# Patient Record
Sex: Male | Born: 1950 | Race: White | Hispanic: No | Marital: Single | Smoking: Former smoker
Health system: Southern US, Community
[De-identification: ages and names within clinical notes are randomized; demographics above are authoritative.]

## PROBLEM LIST (undated history)

## (undated) DIAGNOSIS — Z9889 Other specified postprocedural states: Secondary | ICD-10-CM

## (undated) DIAGNOSIS — I10 Essential (primary) hypertension: Secondary | ICD-10-CM

## (undated) DIAGNOSIS — R002 Palpitations: Secondary | ICD-10-CM

## (undated) DIAGNOSIS — I6529 Occlusion and stenosis of unspecified carotid artery: Secondary | ICD-10-CM

## (undated) DIAGNOSIS — I499 Cardiac arrhythmia, unspecified: Secondary | ICD-10-CM

## (undated) DIAGNOSIS — E785 Hyperlipidemia, unspecified: Secondary | ICD-10-CM

## (undated) HISTORY — DX: Cardiac arrhythmia, unspecified: I49.9

## (undated) HISTORY — DX: Occlusion and stenosis of unspecified carotid artery: I65.29

## (undated) HISTORY — DX: Hyperlipidemia, unspecified: E78.5

## (undated) HISTORY — DX: Essential (primary) hypertension: I10

## (undated) HISTORY — DX: Palpitations: R00.2

## (undated) HISTORY — DX: Other specified postprocedural states: Z98.890

---

## 1970-08-11 HISTORY — PX: PILONIDAL CYST EXCISION: SHX744

## 2016-12-23 DIAGNOSIS — E785 Hyperlipidemia, unspecified: Secondary | ICD-10-CM | POA: Diagnosis not present

## 2016-12-23 DIAGNOSIS — I10 Essential (primary) hypertension: Secondary | ICD-10-CM | POA: Diagnosis not present

## 2016-12-23 DIAGNOSIS — Z720 Tobacco use: Secondary | ICD-10-CM | POA: Diagnosis not present

## 2017-02-10 DIAGNOSIS — M79671 Pain in right foot: Secondary | ICD-10-CM | POA: Diagnosis not present

## 2017-02-10 DIAGNOSIS — L0889 Other specified local infections of the skin and subcutaneous tissue: Secondary | ICD-10-CM | POA: Diagnosis not present

## 2017-03-10 DIAGNOSIS — J329 Chronic sinusitis, unspecified: Secondary | ICD-10-CM | POA: Diagnosis not present

## 2017-03-10 DIAGNOSIS — H6690 Otitis media, unspecified, unspecified ear: Secondary | ICD-10-CM | POA: Diagnosis not present

## 2018-01-05 DIAGNOSIS — I10 Essential (primary) hypertension: Secondary | ICD-10-CM | POA: Diagnosis not present

## 2018-01-05 DIAGNOSIS — E785 Hyperlipidemia, unspecified: Secondary | ICD-10-CM | POA: Diagnosis not present

## 2018-06-08 DIAGNOSIS — D485 Neoplasm of uncertain behavior of skin: Secondary | ICD-10-CM | POA: Diagnosis not present

## 2018-06-08 DIAGNOSIS — Z85828 Personal history of other malignant neoplasm of skin: Secondary | ICD-10-CM | POA: Diagnosis not present

## 2018-06-08 DIAGNOSIS — L570 Actinic keratosis: Secondary | ICD-10-CM | POA: Diagnosis not present

## 2018-09-07 DIAGNOSIS — C44612 Basal cell carcinoma of skin of right upper limb, including shoulder: Secondary | ICD-10-CM | POA: Diagnosis not present

## 2018-09-07 DIAGNOSIS — D225 Melanocytic nevi of trunk: Secondary | ICD-10-CM | POA: Diagnosis not present

## 2018-09-07 DIAGNOSIS — Z85828 Personal history of other malignant neoplasm of skin: Secondary | ICD-10-CM | POA: Diagnosis not present

## 2018-09-07 DIAGNOSIS — C44229 Squamous cell carcinoma of skin of left ear and external auricular canal: Secondary | ICD-10-CM | POA: Diagnosis not present

## 2018-09-07 DIAGNOSIS — L821 Other seborrheic keratosis: Secondary | ICD-10-CM | POA: Diagnosis not present

## 2018-09-07 DIAGNOSIS — L814 Other melanin hyperpigmentation: Secondary | ICD-10-CM | POA: Diagnosis not present

## 2018-12-17 ENCOUNTER — Other Ambulatory Visit: Payer: Self-pay | Admitting: Family Medicine

## 2018-12-17 DIAGNOSIS — G453 Amaurosis fugax: Secondary | ICD-10-CM | POA: Diagnosis not present

## 2018-12-17 DIAGNOSIS — I10 Essential (primary) hypertension: Secondary | ICD-10-CM | POA: Diagnosis not present

## 2018-12-17 DIAGNOSIS — R42 Dizziness and giddiness: Secondary | ICD-10-CM | POA: Diagnosis not present

## 2018-12-17 DIAGNOSIS — E785 Hyperlipidemia, unspecified: Secondary | ICD-10-CM | POA: Diagnosis not present

## 2018-12-17 DIAGNOSIS — H53121 Transient visual loss, right eye: Secondary | ICD-10-CM

## 2018-12-17 DIAGNOSIS — R0989 Other specified symptoms and signs involving the circulatory and respiratory systems: Secondary | ICD-10-CM | POA: Diagnosis not present

## 2018-12-21 ENCOUNTER — Other Ambulatory Visit: Payer: Self-pay | Admitting: Family Medicine

## 2018-12-21 ENCOUNTER — Other Ambulatory Visit (HOSPITAL_COMMUNITY): Payer: Self-pay | Admitting: Family Medicine

## 2018-12-21 ENCOUNTER — Ambulatory Visit
Admission: RE | Admit: 2018-12-21 | Discharge: 2018-12-21 | Disposition: A | Discharge status: Home / Self Care | Payer: PPO | Source: Ambulatory Visit | Attending: Family Medicine | Admitting: Family Medicine

## 2018-12-21 DIAGNOSIS — H53121 Transient visual loss, right eye: Secondary | ICD-10-CM

## 2018-12-22 ENCOUNTER — Other Ambulatory Visit: Payer: Self-pay | Admitting: Family Medicine

## 2018-12-22 DIAGNOSIS — R42 Dizziness and giddiness: Secondary | ICD-10-CM

## 2018-12-22 DIAGNOSIS — H5360 Unspecified night blindness: Secondary | ICD-10-CM

## 2018-12-23 ENCOUNTER — Other Ambulatory Visit: Payer: Self-pay

## 2018-12-23 ENCOUNTER — Ambulatory Visit
Admission: RE | Admit: 2018-12-23 | Discharge: 2018-12-23 | Disposition: A | Discharge status: Home / Self Care | Payer: PPO | Source: Ambulatory Visit | Attending: Family Medicine | Admitting: Family Medicine

## 2018-12-23 DIAGNOSIS — R42 Dizziness and giddiness: Secondary | ICD-10-CM

## 2018-12-23 DIAGNOSIS — H5360 Unspecified night blindness: Secondary | ICD-10-CM

## 2018-12-23 MED ORDER — GADOBENATE DIMEGLUMINE 529 MG/ML IV SOLN
20.0000 mL | Freq: Once | INTRAVENOUS | Status: AC | PRN
  Administered 2018-12-23: 20 mL via INTRAVENOUS

## 2018-12-28 ENCOUNTER — Telehealth (HOSPITAL_COMMUNITY): Payer: Self-pay | Admitting: *Deleted

## 2018-12-28 NOTE — Telephone Encounter (Signed)

## 2018-12-30 ENCOUNTER — Other Ambulatory Visit: Payer: Self-pay

## 2018-12-30 ENCOUNTER — Ambulatory Visit (HOSPITAL_COMMUNITY): Payer: PPO | Attending: Internal Medicine

## 2018-12-30 DIAGNOSIS — H53121 Transient visual loss, right eye: Secondary | ICD-10-CM | POA: Diagnosis not present

## 2018-12-30 DIAGNOSIS — E785 Hyperlipidemia, unspecified: Secondary | ICD-10-CM | POA: Insufficient documentation

## 2018-12-30 DIAGNOSIS — I77810 Thoracic aortic ectasia: Secondary | ICD-10-CM | POA: Diagnosis not present

## 2018-12-30 DIAGNOSIS — I351 Nonrheumatic aortic (valve) insufficiency: Secondary | ICD-10-CM | POA: Diagnosis not present

## 2018-12-30 DIAGNOSIS — I119 Hypertensive heart disease without heart failure: Secondary | ICD-10-CM | POA: Insufficient documentation

## 2019-01-11 ENCOUNTER — Other Ambulatory Visit: Payer: Self-pay

## 2019-01-11 DIAGNOSIS — I6521 Occlusion and stenosis of right carotid artery: Secondary | ICD-10-CM

## 2019-01-17 ENCOUNTER — Telehealth (HOSPITAL_COMMUNITY): Payer: Self-pay | Admitting: Rehabilitation

## 2019-01-17 NOTE — Telephone Encounter (Signed)

## 2019-01-18 ENCOUNTER — Other Ambulatory Visit: Payer: Self-pay

## 2019-01-18 ENCOUNTER — Ambulatory Visit (INDEPENDENT_AMBULATORY_CARE_PROVIDER_SITE_OTHER): Payer: PPO | Admitting: Vascular Surgery

## 2019-01-18 ENCOUNTER — Encounter: Payer: Self-pay | Admitting: Vascular Surgery

## 2019-01-18 ENCOUNTER — Ambulatory Visit (HOSPITAL_COMMUNITY)
Admission: RE | Admit: 2019-01-18 | Discharge: 2019-01-18 | Disposition: A | Discharge status: Home / Self Care | Payer: PPO | Source: Ambulatory Visit | Attending: Family | Admitting: Family

## 2019-01-18 ENCOUNTER — Other Ambulatory Visit: Payer: Self-pay | Admitting: *Deleted

## 2019-01-18 ENCOUNTER — Encounter: Payer: Self-pay | Admitting: *Deleted

## 2019-01-18 DIAGNOSIS — I6521 Occlusion and stenosis of right carotid artery: Secondary | ICD-10-CM

## 2019-01-18 DIAGNOSIS — I6529 Occlusion and stenosis of unspecified carotid artery: Secondary | ICD-10-CM | POA: Insufficient documentation

## 2019-01-18 MED ORDER — CLOPIDOGREL BISULFATE 75 MG PO TABS: 75.0000 mg | ORAL_TABLET | Freq: Every day | ORAL | 6 refills | Status: DC

## 2019-01-18 NOTE — Progress Notes (Signed)
Patient name: Eric Newman MRN: [id number] DOB: Sep 24, 1950 Sex: male  REASON FOR CONSULT: Concern for symptomatic right carotid stenosis  HPI: Eric Newman is a 68 y.o. male, with history of hypertension that presents for evaluation of right carotid stenosis.  Patient states he had an event in early May when he lost vision suddenly in his right eye for approximately 3 minutes.  States it was very similar to a shade coming down over his visual field.  Ultimately went and saw his primary care doctor who sent him to an ophthalmologist and he states he had a good checkup.  An MRI of his brain was done that showed no acute intracranial abnormality.  He did have a carotid duplex that showed greater than 70% right carotid stenosis.  Patient states he has been on Plavix per his PCP.  He has had no recurrent events.  When he had his vision loss he had no unilateral arm or leg symptoms otherwise.  He denies any previous head or neck surgery.  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  Past Medical History:  Diagnosis Date  . Hypertension    PSH: no head or neck surgery  No family history on file.  SOCIAL HISTORY: Social History   Socioeconomic History  . Marital status: Married    Spouse name: Not on file  . Number of children: Not on file  . Years of education: Not on file  . Highest education level: Not on file  Occupational History  . Not on file  Social Needs  . Financial resource strain: Not on file  . Food insecurity:    Worry: Not on file    Inability: Not on file  . Transportation needs:    Medical: Not on file    Non-medical: Not on file  Tobacco Use  . Smoking status: Former Research scientist (life sciences)  . Smokeless tobacco: Never Used  Substance and Sexual Activity  . Alcohol use: Not on file  . Drug use: Not on file  . Sexual activity: Not on file  Lifestyle  . Physical activity:    Days per week: Not on file    Minutes per session: Not on file  . Stress: Not on file   Relationships  . Social connections:    Talks on phone: Not on file    Gets together: Not on file    Attends religious service: Not on file    Active member of club or organization: Not on file    Attends meetings of clubs or organizations: Not on file    Relationship status: Not on file  . Intimate partner violence:    Fear of current or ex partner: Not on file    Emotionally abused: Not on file    Physically abused: Not on file    Forced sexual activity: Not on file  Other Topics Concern  . Not on file  Social History Narrative  . Not on file    Allergies no known allergies  Current Outpatient Medications  Medication Sig Dispense Refill  . aspirin EC 81 MG tablet Take 81 mg by mouth daily.    . famotidine (PEPCID) 10 MG tablet Take 10 mg by mouth 2 (two) times daily.    Marland Kitchen lisinopril-hydrochlorothiazide (ZESTORETIC) 10-12.5 MG tablet TAKE 1 TABLET BY MOUTH ONCE DAILY FOR 90 DAYS    . rosuvastatin (CRESTOR) 20 MG tablet TAKE 1 TABLET BY MOUTH ONCE DAILY FOR 90 DAYS    . clopidogrel (  PLAVIX) 75 MG tablet Take 1 tablet (75 mg total) by mouth daily. 30 tablet 6   No current facility-administered medications for this visit.     REVIEW OF SYSTEMS:  [X]  denotes positive finding, [ ]  denotes negative finding Cardiac  Comments:  Chest pain or chest pressure:    Shortness of breath upon exertion:    Short of breath when lying flat:    Irregular heart rhythm:        Vascular    Pain in calf, thigh, or hip brought on by ambulation:    Pain in feet at night that wakes you up from your sleep:     Blood clot in your veins:    Leg swelling:         Pulmonary    Oxygen at home:    Productive cough:     Wheezing:         Neurologic    Sudden weakness in arms or legs:     Sudden numbness in arms or legs:     Sudden onset of difficulty speaking or slurred speech:    Temporary loss of vision in one eye:     Problems with dizziness:         Gastrointestinal    Blood in stool:      Vomited blood:         Genitourinary    Burning when urinating:     Blood in urine:        Psychiatric    Major depression:         Hematologic    Bleeding problems:    Problems with blood clotting too easily:        Skin    Rashes or ulcers:        Constitutional    Fever or chills:      PHYSICAL EXAM: Vitals:   01/18/19 1210  BP: 132/74  Pulse: 63  Temp: 99.1 F (37.3 C)  TempSrc: Temporal  SpO2: 95%  Weight: 208 lb 6.4 oz (94.5 kg)  Height: 5\' 4"  (1.626 m)    GENERAL: The patient is a well-nourished male, in no acute distress. The vital signs are documented above. CARDIAC: There is a regular rate and rhythm.  VASCULAR:  Right carotid bruit Radial pulse 2+ palpable bilateral upper extremities PULMONARY: There is good air exchange bilaterally without wheezing or rales. ABDOMEN: Soft and non-tender with normal pitched bowel sounds.  MUSCULOSKELETAL: There are no major deformities or cyanosis. NEUROLOGIC: No focal weakness or paresthesias are detected.  Cn II-XII grossly intact.   DATA:   MRI brain 12/23/2018: No acute intracranial abnormality.  Carotid duplex here in clinic shows a greater than 80% right ICA stenosis with velocity of 705/283. Duplex prior to clinic showed <50% L ICA stenosis.  Assessment/Plan:  68 year old male that presents with symptomatic high-grade right carotid stenosis.  I discussed in detail that I think his right eye vision loss in May was amaurosis fugax with atheroembolic event from his carotid lesion.  I recommended right carotid endarterectomy for stroke risk reduction.  Surgery was discussed in detail as well as risks including bleeding, cranial nerve injury, and risk of intraoperative stroke. etc.  Patient seems somewhat surprised with my recommendation and was not expecting to need surgery today.  He is amendable to proceed and we will schedule him for next available date.  I did offer him this Thursday but he wants to do it next  week to give him time  to get his affairs in order.  I did refill his Plavix.  I instructed him to remain on dual antiplatelet therapy with aspirin Plavix.   Marty Heck, MD [facility] Office: [phone] Pager: [place]

## 2019-01-21 NOTE — Pre-Procedure Instructions (Signed)
[facility] [address] [place] [address] Phone: [phone] Fax: [phone]      Your procedure is scheduled on Thursday, June 18th.  Report to [facility] Main Entrance "A" at 10:00 A.M., and check in at the Admitting office.  Call this number if you have problems the morning of surgery:  [phone]  Call [phone] if you have any questions prior to your surgery date Monday-Friday 8am-4pm    Remember:  Do not eat or drink after midnight.   Take these medicines the morning of surgery with A SIP OF WATER   famotidine (PEPCID)   Follow your surgeon's instructions on when to stop/resume Plavix.  If no instructions were given by your surgeon then you will need to call the office to get those instructions.    As of today, STOP taking any, Aleve, Naproxen, Ibuprofen, Motrin, Advil, Goody's, BC's, all herbal medications, fish oil, and all vitamins.    The Morning of Surgery  Do not wear jewelry.  Do not wear lotions, powders, or colognes, or deodorant  Do not shave 48 hours prior to surgery.  Men may shave face and neck.  Do not bring valuables to the hospital.  [facility] is not responsible for any belongings or valuables.  If you are a smoker, DO NOT Smoke 24 hours prior to surgery IF you wear a CPAP at night please bring your mask, tubing, and machine the morning of surgery   Remember that you must have someone to transport you home after your surgery, and remain with you for 24 hours if you are discharged the same day.   Contacts, glasses, hearing aids, dentures or bridgework may not be worn into surgery.    Leave your suitcase in the car.  After surgery it may be brought to your room.  For patients admitted to the hospital, discharge time will be determined by your treatment team.  Patients discharged the day of surgery will not be allowed to drive home.    Special instructions:   [facility]-  Preparing For Surgery  Before surgery, you can play an important role. Because skin is not sterile, your skin needs to be as free of germs as possible. You can reduce the number of germs on your skin by washing with CHG (chlorahexidine gluconate) Soap before surgery.  CHG is an antiseptic cleaner which kills germs and bonds with the skin to continue killing germs even after washing.    Oral Hygiene is also important to reduce your risk of infection.  Remember - BRUSH YOUR TEETH THE MORNING OF SURGERY WITH YOUR REGULAR TOOTHPASTE  Please do not use if you have an allergy to CHG or antibacterial soaps. If your skin becomes reddened/irritated stop using the CHG.  Do not shave (including legs and underarms) for at least 48 hours prior to first CHG shower. It is OK to shave your face.  Please follow these instructions carefully.   1. Shower the NIGHT BEFORE SURGERY and the MORNING OF SURGERY with CHG Soap.   2. If you chose to wash your hair, wash your hair first as usual with your normal shampoo.  3. After you shampoo, rinse your hair and body thoroughly to remove the shampoo.  4. Use CHG as you would any other liquid soap. You can apply CHG directly to the skin and wash gently with a scrungie or a clean washcloth.   5. Apply the CHG Soap to  your body ONLY FROM THE NECK DOWN.  Do not use on open wounds or open sores. Avoid contact with your eyes, ears, mouth and genitals (private parts). Wash Face and genitals (private parts)  with your normal soap.   6. Wash thoroughly, paying special attention to the area where your surgery will be performed.  7. Thoroughly rinse your body with warm water from the neck down.  8. DO NOT shower/wash with your normal soap after using and rinsing off the CHG Soap.  9. Pat yourself dry with a CLEAN TOWEL.  10. Wear CLEAN PAJAMAS to bed the night before surgery, wear comfortable clothes the morning of surgery  11. Place CLEAN SHEETS on your bed the night of  your first shower and DO NOT SLEEP WITH PETS.    Day of Surgery:  Do not apply any deodorants/lotions.  Please wear clean clothes to the hospital/surgery center.   Remember to brush your teeth WITH YOUR REGULAR TOOTHPASTE.   Please read over the following fact sheets that you were given.

## 2019-01-24 ENCOUNTER — Encounter (HOSPITAL_COMMUNITY): Payer: Self-pay

## 2019-01-24 ENCOUNTER — Other Ambulatory Visit: Payer: Self-pay

## 2019-01-24 ENCOUNTER — Encounter (HOSPITAL_COMMUNITY)
Admission: RE | Admit: 2019-01-24 | Discharge: 2019-01-24 | Disposition: A | Discharge status: Home / Self Care | Payer: PPO | Source: Ambulatory Visit | Attending: Vascular Surgery | Admitting: Vascular Surgery

## 2019-01-24 ENCOUNTER — Other Ambulatory Visit (HOSPITAL_COMMUNITY)
Admission: RE | Admit: 2019-01-24 | Discharge: 2019-01-24 | Disposition: A | Discharge status: Home / Self Care | Payer: PPO | Source: Ambulatory Visit | Attending: Vascular Surgery | Admitting: Vascular Surgery

## 2019-01-24 DIAGNOSIS — Z7902 Long term (current) use of antithrombotics/antiplatelets: Secondary | ICD-10-CM | POA: Diagnosis not present

## 2019-01-24 DIAGNOSIS — I6521 Occlusion and stenosis of right carotid artery: Secondary | ICD-10-CM | POA: Diagnosis present

## 2019-01-24 DIAGNOSIS — I10 Essential (primary) hypertension: Secondary | ICD-10-CM | POA: Diagnosis present

## 2019-01-24 DIAGNOSIS — Z7982 Long term (current) use of aspirin: Secondary | ICD-10-CM | POA: Diagnosis not present

## 2019-01-24 DIAGNOSIS — Z87891 Personal history of nicotine dependence: Secondary | ICD-10-CM | POA: Diagnosis not present

## 2019-01-24 DIAGNOSIS — Z01818 Encounter for other preprocedural examination: Secondary | ICD-10-CM | POA: Insufficient documentation

## 2019-01-24 DIAGNOSIS — Z1159 Encounter for screening for other viral diseases: Secondary | ICD-10-CM | POA: Diagnosis not present

## 2019-01-24 LAB — COMPREHENSIVE METABOLIC PANEL
ALT: 26 U/L (ref 0–44)
AST: 24 U/L (ref 15–41)
Albumin: 4.1 g/dL (ref 3.5–5.0)
Alkaline Phosphatase: 68 U/L (ref 38–126)
Anion gap: 10 (ref 5–15)
BUN: 22 mg/dL (ref 8–23)
CO2: 25 mmol/L (ref 22–32)
Calcium: 9.4 mg/dL (ref 8.9–10.3)
Chloride: 102 mmol/L (ref 98–111)
Creatinine, Ser: 1.26 mg/dL — ABNORMAL HIGH (ref 0.61–1.24)
GFR calc Af Amer: >60 mL/min (ref >60)
GFR calc non Af Amer: 58 mL/min — ABNORMAL LOW (ref >60)
Glucose, Bld: 111 mg/dL — ABNORMAL HIGH (ref 70–99)
Potassium: 4.4 mmol/L (ref 3.5–5.1)
Sodium: 137 mmol/L (ref 135–145)
Total Bilirubin: 0.7 mg/dL (ref 0.3–1.2)
Total Protein: 7.7 g/dL (ref 6.5–8.1)

## 2019-01-24 LAB — URINALYSIS, ROUTINE W REFLEX MICROSCOPIC
Bilirubin Urine: NEGATIVE
Glucose, UA: NEGATIVE mg/dL
Hgb urine dipstick: NEGATIVE
Ketones, ur: NEGATIVE mg/dL
Leukocytes,Ua: NEGATIVE
Nitrite: NEGATIVE
Protein, ur: NEGATIVE mg/dL
Specific Gravity, Urine: 1.017 (ref 1.005–1.030)
pH: 5 (ref 5.0–8.0)

## 2019-01-24 LAB — SURGICAL PCR SCREEN
MRSA, PCR: NEGATIVE
Staphylococcus aureus: NEGATIVE

## 2019-01-24 LAB — CBC
HCT: 46 % (ref 39.0–52.0)
Hemoglobin: 15.7 g/dL (ref 13.0–17.0)
MCH: 32.8 pg (ref 26.0–34.0)
MCHC: 34.1 g/dL (ref 30.0–36.0)
MCV: 96 fL (ref 80.0–100.0)
Platelets: 258 10*3/uL (ref 150–400)
RBC: 4.79 MIL/uL (ref 4.22–5.81)
RDW: 12.5 % (ref 11.5–15.5)
WBC: 8 10*3/uL (ref 4.0–10.5)
nRBC: 0 % (ref 0.0–0.2)

## 2019-01-24 LAB — APTT: aPTT: 31 seconds (ref 24–36)

## 2019-01-24 LAB — TYPE AND SCREEN
ABO/RH(D): A POS
Antibody Screen: NEGATIVE

## 2019-01-24 LAB — PROTIME-INR
INR: 1 (ref 0.8–1.2)
Prothrombin Time: 12.7 seconds (ref 11.4–15.2)

## 2019-01-24 LAB — ABO/RH: ABO/RH(D): A POS

## 2019-01-24 NOTE — Progress Notes (Addendum)
  Coronavirus Screening  Pt scheduled for COVID test at [facility]-today Have you experienced the following symptoms:  Cough yes/no: No Fever (>100.75F)  yes/no: No Runny nose yes/no: No Sore throat yes/no: No Difficulty breathing/shortness of breath  yes/no: No Loss of smell or taste-No Have you or a family member traveled in the last 14 days and where? yes/no: No  PCP - Dr London Pepper  (Records requested)  Cardiologist - denies  Chest x-ray - NA  EKG - 01-24-19  Stress Test - denies  ECHO -12/30/18   Cardiac Cath - denies  AICD-denies PM-denies LOOP-denies  Sleep Study - denies CPAP - NA  LABS-PCR, CBC,CMP,APTT,PT-INR,T/S,UA   Per patient, Dr. has advised patient to continue taking Aspirin and Plavix till day of surgery. Will check PT-INR today to get a baseline.  ERAS-NA  HA1C-NA Fasting Blood Sugar -  Checks Blood Sugar _____ times a day  Anesthesia-Y. Review requested records  Pt denies having chest pain, sob, or fever at this time. All instructions explained to the pt, with a verbal understanding of the material. Pt agrees to go over the instructions while at home for a better understanding. The opportunity to ask questions was provided.

## 2019-01-25 LAB — NOVEL CORONAVIRUS, NAA (HOSP ORDER, SEND-OUT TO REF LAB; TAT 18-24 HRS): SARS-CoV-2, NAA: NOT DETECTED

## 2019-01-27 ENCOUNTER — Inpatient Hospital Stay (HOSPITAL_COMMUNITY): Payer: PPO | Admitting: Physician Assistant

## 2019-01-27 ENCOUNTER — Encounter (HOSPITAL_COMMUNITY): Payer: Self-pay

## 2019-01-27 ENCOUNTER — Inpatient Hospital Stay (HOSPITAL_COMMUNITY)
Admission: RE | Admit: 2019-01-27 | Discharge: 2019-01-28 | DRG: 039 | Disposition: A | Discharge status: Home / Self Care | Payer: PPO | Attending: Vascular Surgery | Admitting: Vascular Surgery

## 2019-01-27 ENCOUNTER — Encounter (HOSPITAL_COMMUNITY)
Admission: RE | Disposition: A | Discharge status: Home / Self Care | Payer: Self-pay | Source: Home / Self Care | Attending: Vascular Surgery

## 2019-01-27 ENCOUNTER — Other Ambulatory Visit: Payer: Self-pay

## 2019-01-27 ENCOUNTER — Inpatient Hospital Stay (HOSPITAL_COMMUNITY): Payer: PPO | Admitting: Anesthesiology

## 2019-01-27 DIAGNOSIS — Z7982 Long term (current) use of aspirin: Secondary | ICD-10-CM

## 2019-01-27 DIAGNOSIS — I10 Essential (primary) hypertension: Secondary | ICD-10-CM | POA: Diagnosis present

## 2019-01-27 DIAGNOSIS — I6521 Occlusion and stenosis of right carotid artery: Principal | ICD-10-CM | POA: Diagnosis present

## 2019-01-27 DIAGNOSIS — Z1159 Encounter for screening for other viral diseases: Secondary | ICD-10-CM

## 2019-01-27 DIAGNOSIS — Z87891 Personal history of nicotine dependence: Secondary | ICD-10-CM

## 2019-01-27 DIAGNOSIS — Z7902 Long term (current) use of antithrombotics/antiplatelets: Secondary | ICD-10-CM | POA: Diagnosis not present

## 2019-01-27 DIAGNOSIS — I6529 Occlusion and stenosis of unspecified carotid artery: Secondary | ICD-10-CM | POA: Diagnosis present

## 2019-01-27 HISTORY — PX: ENDARTERECTOMY: SHX5162

## 2019-01-27 LAB — POCT ACTIVATED CLOTTING TIME: Activated Clotting Time: 323 seconds

## 2019-01-27 SURGERY — ENDARTERECTOMY, CAROTID
Anesthesia: General | Site: Neck | Laterality: Right

## 2019-01-27 MED ORDER — HEPARIN SODIUM (PORCINE) 1000 UNIT/ML IJ SOLN
INTRAMUSCULAR | Status: AC
  Filled 2019-01-27: qty 1

## 2019-01-27 MED ORDER — ALUM & MAG HYDROXIDE-SIMETH 200-200-20 MG/5ML PO SUSP: 15.0000 mL | ORAL | Status: DC | PRN

## 2019-01-27 MED ORDER — OXYCODONE HCL 5 MG PO TABS
5.0000 mg | ORAL_TABLET | Freq: Once | ORAL | Status: AC | PRN
  Administered 2019-01-27: 5 mg via ORAL

## 2019-01-27 MED ORDER — MAGNESIUM SULFATE 2 GM/50ML IV SOLN: 2.0000 g | Freq: Every day | INTRAVENOUS | Status: DC | PRN

## 2019-01-27 MED ORDER — SUGAMMADEX SODIUM 200 MG/2ML IV SOLN
INTRAVENOUS | Status: DC | PRN
  Administered 2019-01-27: 200 mg via INTRAVENOUS

## 2019-01-27 MED ORDER — LISINOPRIL 10 MG PO TABS
10.0000 mg | ORAL_TABLET | Freq: Every day | ORAL | Status: DC
  Filled 2019-01-27: qty 1

## 2019-01-27 MED ORDER — PANTOPRAZOLE SODIUM 40 MG PO TBEC
40.0000 mg | DELAYED_RELEASE_TABLET | Freq: Every day | ORAL | Status: DC
  Filled 2019-01-27: qty 1

## 2019-01-27 MED ORDER — 0.9 % SODIUM CHLORIDE (POUR BTL) OPTIME
TOPICAL | Status: DC | PRN
  Administered 2019-01-27 (×3): 1000 mL

## 2019-01-27 MED ORDER — GUAIFENESIN-DM 100-10 MG/5ML PO SYRP: 15.0000 mL | ORAL_SOLUTION | ORAL | Status: DC | PRN

## 2019-01-27 MED ORDER — DOCUSATE SODIUM 100 MG PO CAPS: 100.0000 mg | ORAL_CAPSULE | Freq: Every day | ORAL | Status: DC

## 2019-01-27 MED ORDER — SUCCINYLCHOLINE CHLORIDE 20 MG/ML IJ SOLN
INTRAMUSCULAR | Status: DC | PRN
  Administered 2019-01-27: 120 mg via INTRAVENOUS

## 2019-01-27 MED ORDER — ONDANSETRON HCL 4 MG/2ML IJ SOLN: 4.0000 mg | Freq: Four times a day (QID) | INTRAMUSCULAR | Status: DC | PRN

## 2019-01-27 MED ORDER — FENTANYL CITRATE (PF) 100 MCG/2ML IJ SOLN: 25.0000 ug | INTRAMUSCULAR | Status: DC | PRN

## 2019-01-27 MED ORDER — SODIUM CHLORIDE 0.9 % IV SOLN
INTRAVENOUS | Status: AC
  Filled 2019-01-27: qty 1.2

## 2019-01-27 MED ORDER — POLYETHYLENE GLYCOL 3350 17 G PO PACK: 17.0000 g | PACK | Freq: Every day | ORAL | Status: DC | PRN

## 2019-01-27 MED ORDER — HYDRALAZINE HCL 20 MG/ML IJ SOLN: 5.0000 mg | INTRAMUSCULAR | Status: DC | PRN

## 2019-01-27 MED ORDER — FAMOTIDINE 20 MG PO TABS
10.0000 mg | ORAL_TABLET | Freq: Two times a day (BID) | ORAL | Status: DC
  Filled 2019-01-27 (×2): qty 1

## 2019-01-27 MED ORDER — ASPIRIN EC 81 MG PO TBEC
81.0000 mg | DELAYED_RELEASE_TABLET | Freq: Every evening | ORAL | Status: DC
  Administered 2019-01-27: 81 mg via ORAL
  Filled 2019-01-27: qty 1

## 2019-01-27 MED ORDER — NITROGLYCERIN 0.2 MG/ML ON CALL CATH LAB
INTRAVENOUS | Status: DC | PRN
  Administered 2019-01-27: 20 ug via INTRAVENOUS
  Administered 2019-01-27 (×3): 40 ug via INTRAVENOUS
  Administered 2019-01-27: 20 ug via INTRAVENOUS

## 2019-01-27 MED ORDER — PHENOL 1.4 % MT LIQD: 1.0000 | OROMUCOSAL | Status: DC | PRN

## 2019-01-27 MED ORDER — MORPHINE SULFATE (PF) 2 MG/ML IV SOLN: 2.0000 mg | INTRAVENOUS | Status: DC | PRN

## 2019-01-27 MED ORDER — POTASSIUM CHLORIDE CRYS ER 20 MEQ PO TBCR: 20.0000 meq | EXTENDED_RELEASE_TABLET | Freq: Every day | ORAL | Status: DC | PRN

## 2019-01-27 MED ORDER — HEPARIN SODIUM (PORCINE) 1000 UNIT/ML IJ SOLN
INTRAMUSCULAR | Status: DC | PRN
  Administered 2019-01-27: 9000 [IU] via INTRAVENOUS

## 2019-01-27 MED ORDER — LIDOCAINE HCL (PF) 1 % IJ SOLN
INTRAMUSCULAR | Status: AC
  Filled 2019-01-27: qty 30

## 2019-01-27 MED ORDER — LABETALOL HCL 5 MG/ML IV SOLN: 10.0000 mg | INTRAVENOUS | Status: DC | PRN

## 2019-01-27 MED ORDER — ONDANSETRON HCL 4 MG/2ML IJ SOLN
INTRAMUSCULAR | Status: AC
  Filled 2019-01-27: qty 2

## 2019-01-27 MED ORDER — CHLORHEXIDINE GLUCONATE 4 % EX LIQD: 60.0000 mL | Freq: Once | CUTANEOUS | Status: DC

## 2019-01-27 MED ORDER — OXYCODONE HCL 5 MG PO TABS
ORAL_TABLET | ORAL | Status: AC
  Filled 2019-01-27: qty 1

## 2019-01-27 MED ORDER — METOPROLOL TARTRATE 5 MG/5ML IV SOLN: 2.0000 mg | INTRAVENOUS | Status: DC | PRN

## 2019-01-27 MED ORDER — PROTAMINE SULFATE 10 MG/ML IV SOLN
INTRAVENOUS | Status: DC | PRN
  Administered 2019-01-27 (×4): 10 mg via INTRAVENOUS

## 2019-01-27 MED ORDER — CEFAZOLIN SODIUM-DEXTROSE 2-4 GM/100ML-% IV SOLN
INTRAVENOUS | Status: AC
  Filled 2019-01-27: qty 100

## 2019-01-27 MED ORDER — LISINOPRIL-HYDROCHLOROTHIAZIDE 10-12.5 MG PO TABS: 1.0000 | ORAL_TABLET | Freq: Every day | ORAL | Status: DC

## 2019-01-27 MED ORDER — ACETAMINOPHEN 325 MG RE SUPP: 325.0000 mg | RECTAL | Status: DC | PRN

## 2019-01-27 MED ORDER — PROTAMINE SULFATE 10 MG/ML IV SOLN
INTRAVENOUS | Status: AC
  Filled 2019-01-27: qty 5

## 2019-01-27 MED ORDER — FENTANYL CITRATE (PF) 250 MCG/5ML IJ SOLN
INTRAMUSCULAR | Status: AC
  Filled 2019-01-27: qty 5

## 2019-01-27 MED ORDER — SODIUM CHLORIDE 0.9 % IV SOLN
INTRAVENOUS | Status: DC | PRN
  Administered 2019-01-27: 13:00:00 50 ug/min via INTRAVENOUS

## 2019-01-27 MED ORDER — LIDOCAINE 2% (20 MG/ML) 5 ML SYRINGE
INTRAMUSCULAR | Status: AC
  Filled 2019-01-27: qty 10

## 2019-01-27 MED ORDER — HYDROCHLOROTHIAZIDE 12.5 MG PO CAPS
12.5000 mg | ORAL_CAPSULE | Freq: Every day | ORAL | Status: DC
  Filled 2019-01-27: qty 1

## 2019-01-27 MED ORDER — DEXAMETHASONE SODIUM PHOSPHATE 10 MG/ML IJ SOLN
INTRAMUSCULAR | Status: DC | PRN
  Administered 2019-01-27: 10 mg via INTRAVENOUS

## 2019-01-27 MED ORDER — FENTANYL CITRATE (PF) 100 MCG/2ML IJ SOLN
INTRAMUSCULAR | Status: DC | PRN
  Administered 2019-01-27 (×2): 50 ug via INTRAVENOUS
  Administered 2019-01-27: 100 ug via INTRAVENOUS
  Administered 2019-01-27: 50 ug via INTRAVENOUS

## 2019-01-27 MED ORDER — ROSUVASTATIN CALCIUM 20 MG PO TABS
20.0000 mg | ORAL_TABLET | Freq: Every evening | ORAL | Status: DC
  Administered 2019-01-27: 20 mg via ORAL
  Filled 2019-01-27: qty 1

## 2019-01-27 MED ORDER — OXYCODONE-ACETAMINOPHEN 5-325 MG PO TABS: 1.0000 | ORAL_TABLET | ORAL | Status: DC | PRN

## 2019-01-27 MED ORDER — SODIUM CHLORIDE 0.9 % IV SOLN: INTRAVENOUS | Status: DC

## 2019-01-27 MED ORDER — LACTATED RINGERS IV SOLN
INTRAVENOUS | Status: DC | PRN
  Administered 2019-01-27 (×2): via INTRAVENOUS

## 2019-01-27 MED ORDER — CEFAZOLIN SODIUM-DEXTROSE 2-4 GM/100ML-% IV SOLN
2.0000 g | INTRAVENOUS | Status: AC
  Administered 2019-01-27: 2 g via INTRAVENOUS

## 2019-01-27 MED ORDER — ROCURONIUM BROMIDE 10 MG/ML (PF) SYRINGE
PREFILLED_SYRINGE | INTRAVENOUS | Status: AC
  Filled 2019-01-27: qty 10

## 2019-01-27 MED ORDER — ONDANSETRON HCL 4 MG/2ML IJ SOLN: 4.0000 mg | Freq: Once | INTRAMUSCULAR | Status: DC | PRN

## 2019-01-27 MED ORDER — OXYCODONE HCL 5 MG/5ML PO SOLN: 5.0000 mg | Freq: Once | ORAL | Status: AC | PRN

## 2019-01-27 MED ORDER — ROCURONIUM BROMIDE 50 MG/5ML IV SOSY
PREFILLED_SYRINGE | INTRAVENOUS | Status: DC | PRN
  Administered 2019-01-27: 60 mg via INTRAVENOUS
  Administered 2019-01-27: 10 mg via INTRAVENOUS

## 2019-01-27 MED ORDER — SODIUM CHLORIDE 0.9 % IV SOLN
INTRAVENOUS | Status: DC | PRN
  Administered 2019-01-27: 500 mL

## 2019-01-27 MED ORDER — SODIUM CHLORIDE 0.9 % IV SOLN: 500.0000 mL | Freq: Once | INTRAVENOUS | Status: DC | PRN

## 2019-01-27 MED ORDER — CEFAZOLIN SODIUM-DEXTROSE 2-4 GM/100ML-% IV SOLN
2.0000 g | Freq: Three times a day (TID) | INTRAVENOUS | Status: AC
  Administered 2019-01-27 – 2019-01-28 (×2): 2 g via INTRAVENOUS
  Filled 2019-01-27 (×2): qty 100

## 2019-01-27 MED ORDER — ACETAMINOPHEN 325 MG PO TABS: 325.0000 mg | ORAL_TABLET | ORAL | Status: DC | PRN

## 2019-01-27 MED ORDER — DEXAMETHASONE SODIUM PHOSPHATE 10 MG/ML IJ SOLN
INTRAMUSCULAR | Status: AC
  Filled 2019-01-27: qty 1

## 2019-01-27 MED ORDER — SODIUM CHLORIDE 0.9 % IV SOLN
INTRAVENOUS | Status: DC
  Administered 2019-01-27: 18:00:00 via INTRAVENOUS

## 2019-01-27 MED ORDER — LACTATED RINGERS IV SOLN
INTRAVENOUS | Status: DC
  Administered 2019-01-27: 11:00:00 via INTRAVENOUS

## 2019-01-27 MED ORDER — ONDANSETRON HCL 4 MG/2ML IJ SOLN
INTRAMUSCULAR | Status: DC | PRN
  Administered 2019-01-27: 4 mg via INTRAVENOUS

## 2019-01-27 MED ORDER — PROPOFOL 10 MG/ML IV BOLUS
INTRAVENOUS | Status: DC | PRN
  Administered 2019-01-27: 180 mg via INTRAVENOUS

## 2019-01-27 MED ORDER — LIDOCAINE 2% (20 MG/ML) 5 ML SYRINGE
INTRAMUSCULAR | Status: DC | PRN
  Administered 2019-01-27: 100 mg via INTRAVENOUS

## 2019-01-27 MED ORDER — SUCCINYLCHOLINE CHLORIDE 200 MG/10ML IV SOSY
PREFILLED_SYRINGE | INTRAVENOUS | Status: AC
  Filled 2019-01-27: qty 10

## 2019-01-27 MED ORDER — HEMOSTATIC AGENTS (NO CHARGE) OPTIME
TOPICAL | Status: DC | PRN
  Administered 2019-01-27: 1 via TOPICAL

## 2019-01-27 SURGICAL SUPPLY — 61 items
CANISTER SUCT 3000ML PPV (MISCELLANEOUS) ×3 IMPLANT
CATH ROBINSON RED A/P 18FR (CATHETERS) ×3 IMPLANT
CLIP VESOCCLUDE MED 24/CT (CLIP) ×3 IMPLANT
CLIP VESOCCLUDE SM WIDE 24/CT (CLIP) ×3 IMPLANT
COVER PROBE W GEL 5X96 (DRAPES) IMPLANT
COVER TRANSDUCER ULTRASND GEL (DRAPE) ×3 IMPLANT
CRADLE DONUT ADULT HEAD (MISCELLANEOUS) ×3 IMPLANT
DERMABOND ADVANCED (GAUZE/BANDAGES/DRESSINGS) ×4
DERMABOND ADVANCED .7 DNX12 (GAUZE/BANDAGES/DRESSINGS) ×2 IMPLANT
DRAIN CHANNEL 15F RND FF W/TCR (WOUND CARE) IMPLANT
ELECT REM PT RETURN 9FT ADLT (ELECTROSURGICAL) ×3
ELECTRODE REM PT RTRN 9FT ADLT (ELECTROSURGICAL) ×1 IMPLANT
EVACUATOR SILICONE 100CC (DRAIN) IMPLANT
GLOVE BIO SURGEON STRL SZ 6.5 (GLOVE) ×4 IMPLANT
GLOVE BIO SURGEON STRL SZ7 (GLOVE) ×3 IMPLANT
GLOVE BIO SURGEON STRL SZ7.5 (GLOVE) ×3 IMPLANT
GLOVE BIO SURGEONS STRL SZ 6.5 (GLOVE) ×2
GLOVE BIOGEL PI IND STRL 6.5 (GLOVE) ×1 IMPLANT
GLOVE BIOGEL PI IND STRL 7.0 (GLOVE) ×1 IMPLANT
GLOVE BIOGEL PI IND STRL 8 (GLOVE) ×1 IMPLANT
GLOVE BIOGEL PI INDICATOR 6.5 (GLOVE) ×2
GLOVE BIOGEL PI INDICATOR 7.0 (GLOVE) ×2
GLOVE BIOGEL PI INDICATOR 8 (GLOVE) ×2
GLOVE ECLIPSE 6.5 STRL STRAW (GLOVE) ×3 IMPLANT
GLOVE SURG SS PI 6.5 STRL IVOR (GLOVE) ×3 IMPLANT
GOWN STRL REUS W/ TWL LRG LVL3 (GOWN DISPOSABLE) ×3 IMPLANT
GOWN STRL REUS W/ TWL XL LVL3 (GOWN DISPOSABLE) ×1 IMPLANT
GOWN STRL REUS W/TWL LRG LVL3 (GOWN DISPOSABLE) ×6
GOWN STRL REUS W/TWL XL LVL3 (GOWN DISPOSABLE) ×2
HEMOSTAT SNOW SURGICEL 2X4 (HEMOSTASIS) ×3 IMPLANT
IV ADAPTER SYR DOUBLE MALE LL (MISCELLANEOUS) IMPLANT
KIT BASIN OR (CUSTOM PROCEDURE TRAY) ×3 IMPLANT
KIT SHUNT ARGYLE CAROTID ART 6 (VASCULAR PRODUCTS) IMPLANT
KIT TURNOVER KIT B (KITS) ×3 IMPLANT
LOOP VESSEL MINI RED (MISCELLANEOUS) IMPLANT
NEEDLE HYPO 25GX1X1/2 BEV (NEEDLE) IMPLANT
NEEDLE SPNL 20GX3.5 QUINCKE YW (NEEDLE) IMPLANT
NS IRRIG 1000ML POUR BTL (IV SOLUTION) ×9 IMPLANT
PACK CAROTID (CUSTOM PROCEDURE TRAY) ×3 IMPLANT
PAD ARMBOARD 7.5X6 YLW CONV (MISCELLANEOUS) ×6 IMPLANT
PATCH VASC XENOSURE 1CMX6CM (Vascular Products) ×2 IMPLANT
PATCH VASC XENOSURE 1X6 (Vascular Products) ×1 IMPLANT
SHUNT CAROTID BYPASS 10 (VASCULAR PRODUCTS) ×3 IMPLANT
SHUNT CAROTID BYPASS 12FRX15.5 (VASCULAR PRODUCTS) IMPLANT
SPONGE SURGIFOAM ABS GEL 100 (HEMOSTASIS) IMPLANT
STOPCOCK 4 WAY LG BORE MALE ST (IV SETS) IMPLANT
SUT ETHILON 3 0 PS 1 (SUTURE) IMPLANT
SUT MNCRL AB 4-0 PS2 18 (SUTURE) ×3 IMPLANT
SUT PROLENE 5 0 C 1 24 (SUTURE) ×3 IMPLANT
SUT PROLENE 6 0 BV (SUTURE) ×15 IMPLANT
SUT PROLENE BLUE 7 0 (SUTURE) ×3 IMPLANT
SUT SILK 3 0 (SUTURE)
SUT SILK 3-0 18XBRD TIE 12 (SUTURE) IMPLANT
SUT VIC AB 2-0 CT1 27 (SUTURE) ×2
SUT VIC AB 2-0 CT1 TAPERPNT 27 (SUTURE) ×1 IMPLANT
SUT VIC AB 3-0 SH 27 (SUTURE) ×2
SUT VIC AB 3-0 SH 27X BRD (SUTURE) ×1 IMPLANT
SYR CONTROL 10ML LL (SYRINGE) IMPLANT
TOWEL GREEN STERILE (TOWEL DISPOSABLE) ×3 IMPLANT
TUBING ART PRESS 48 MALE/FEM (TUBING) IMPLANT
WATER STERILE IRR 1000ML POUR (IV SOLUTION) ×3 IMPLANT

## 2019-01-27 NOTE — Anesthesia Procedure Notes (Signed)
Arterial Line Insertion Start/End6/18/2020 12:10 PM, 01/27/2019 12:30 PM Performed by: Neldon Newport, CRNA, CRNA  Patient location: Pre-op. Preanesthetic checklist: patient identified, IV checked, risks and benefits discussed, surgical consent, monitors and equipment checked, pre-op evaluation and timeout performed Lidocaine 1% used for infiltration Right, radial was placed Catheter size: 20 G Hand hygiene performed  and maximum sterile barriers used   Attempts: 1 Procedure performed without using ultrasound guided technique. Following insertion, dressing applied and Biopatch. Post procedure assessment: normal and unchanged  Patient tolerated the procedure well with no immediate complications.

## 2019-01-27 NOTE — Anesthesia Preprocedure Evaluation (Addendum)
Anesthesia Evaluation  Patient identified by MRN, date of birth, ID band Patient awake    Reviewed: Allergy & Precautions, NPO status , Unable to perform ROS - Chart review only  Airway Mallampati: II  TM Distance: >3 FB Neck ROM: full    Dental  (+) Teeth Intact, Dental Advidsory Given   Pulmonary former smoker,    breath sounds clear to auscultation       Cardiovascular hypertension,  Rhythm:regular Rate:Normal     Neuro/Psych    GI/Hepatic   Endo/Other    Renal/GU      Musculoskeletal   Abdominal   Peds  Hematology   Anesthesia Other Findings   Reproductive/Obstetrics                            Anesthesia Physical Anesthesia Plan  ASA: III  Anesthesia Plan: General   Post-op Pain Management:    Induction: Intravenous  PONV Risk Score and Plan: Ondansetron and Dexamethasone  Airway Management Planned: Oral ETT  Additional Equipment:   Intra-op Plan:   Post-operative Plan: Extubation in OR  Informed Consent: I have reviewed the patients History and Physical, chart, labs and discussed the procedure including the risks, benefits and alternatives for the proposed anesthesia with the patient or authorized representative who has indicated his/her understanding and acceptance.     Dental Advisory Given  Plan Discussed with: CRNA and Anesthesiologist  Anesthesia Plan Comments:        Anesthesia Quick Evaluation

## 2019-01-27 NOTE — Anesthesia Postprocedure Evaluation (Signed)
Anesthesia Post Note  Patient: MOUSSA WIEGAND  Procedure(s) Performed: ENDARTERECTOMY CAROTID RIGHT (Right Neck)     Patient location during evaluation: PACU Anesthesia Type: General Level of consciousness: awake and alert Pain management: pain level controlled Vital Signs Assessment: post-procedure vital signs reviewed and stable Respiratory status: spontaneous breathing, nonlabored ventilation, respiratory function stable and patient connected to nasal cannula oxygen Cardiovascular status: blood pressure returned to baseline and stable Postop Assessment: no apparent nausea or vomiting Anesthetic complications: no    Last Vitals:  Vitals:   01/27/19 1609 01/27/19 1629  BP: (!) 108/54 98/66  Pulse: (!) 53   Resp: 17   Temp: 36.6 C 36.4 C  SpO2: 96% 95%    Last Pain:  Vitals:   01/27/19 1629  TempSrc: Oral  PainSc:                  Nasser Ku COKER

## 2019-01-27 NOTE — Anesthesia Procedure Notes (Signed)
Procedure Name: Intubation Date/Time: 01/27/2019 1:11 PM Performed by: Neldon Newport, CRNA Pre-anesthesia Checklist: Timeout performed, Patient being monitored, Suction available, Emergency Drugs available and Patient identified Patient Re-evaluated:Patient Re-evaluated prior to induction Oxygen Delivery Method: Circle system utilized Preoxygenation: Pre-oxygenation with 100% oxygen Induction Type: IV induction Ventilation: Mask ventilation without difficulty Laryngoscope Size: Mac Grade View: Grade I Tube type: Oral Tube size: 7.5 mm Number of attempts: 1 Placement Confirmation: breath sounds checked- equal and bilateral,  positive ETCO2 and ETT inserted through vocal cords under direct vision Secured at: 23 cm Tube secured with: Tape Dental Injury: Teeth and Oropharynx as per pre-operative assessment

## 2019-01-27 NOTE — H&P (Signed)
History and Physical Interval Note:  01/27/2019 12:43 PM  Eric Newman  has presented today for surgery, with the diagnosis of RIGHT CAROTID ARTERY STENOSIS.  The various methods of treatment have been discussed with the patient and family. After consideration of risks, benefits and other options for treatment, the patient has consented to  Procedure(s): ENDARTERECTOMY CAROTID RIGHT (Right) as a surgical intervention.  The patient's history has been reviewed, patient examined, no change in status, stable for surgery.  I have reviewed the patient's chart and labs.  Questions were answered to the patient's satisfaction.    R CEA.  Marty Heck  Patient name: Eric Newman  MRN: [id number]        DOB: 06/24/1951            Sex: male  REASON FOR CONSULT: Concern for symptomatic right carotid stenosis  HPI: Eric Newman is a 68 y.o. male, with history of hypertension that presents for evaluation of right carotid stenosis.  Patient states he had an event in early May when he lost vision suddenly in his right eye for approximately 3 minutes.  States it was very similar to a shade coming down over his visual field.  Ultimately went and saw his primary care doctor who sent him to an ophthalmologist and he states he had a good checkup.  An MRI of his brain was done that showed no acute intracranial abnormality.  He did have a carotid duplex that showed greater than 70% right carotid stenosis.  Patient states he has been on Plavix per his PCP.  He has had no recurrent events.  When he had his vision loss he had no unilateral arm or leg symptoms otherwise.  He denies any previous head or neck surgery.  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      Past Medical History:  Diagnosis Date  . Hypertension    PSH: no head or neck surgery  No family history on file.  SOCIAL HISTORY: Social History        Socioeconomic History  . Marital status: Married    Spouse name:  Not on file  . Number of children: Not on file  . Years of education: Not on file  . Highest education level: Not on file  Occupational History  . Not on file  Social Needs  . Financial resource strain: Not on file  . Food insecurity:    Worry: Not on file    Inability: Not on file  . Transportation needs:    Medical: Not on file    Non-medical: Not on file  Tobacco Use  . Smoking status: Former Research scientist (life sciences)  . Smokeless tobacco: Never Used  Substance and Sexual Activity  . Alcohol use: Not on file  . Drug use: Not on file  . Sexual activity: Not on file  Lifestyle  . Physical activity:    Days per week: Not on file    Minutes per session: Not on file  . Stress: Not on file  Relationships  . Social connections:    Talks on phone: Not on file    Gets together: Not on file    Attends religious service: Not on file    Active member of club or organization: Not on file    Attends meetings of clubs or organizations: Not on file    Relationship status: Not on file  . Intimate partner violence:    Fear of current or ex partner: Not  on file    Emotionally abused: Not on file    Physically abused: Not on file    Forced sexual activity: Not on file  Other Topics Concern  . Not on file  Social History Narrative  . Not on file    Allergies no known allergies        Current Outpatient Medications  Medication Sig Dispense Refill  . aspirin EC 81 MG tablet Take 81 mg by mouth daily.    . famotidine (PEPCID) 10 MG tablet Take 10 mg by mouth 2 (two) times daily.    Marland Kitchen lisinopril-hydrochlorothiazide (ZESTORETIC) 10-12.5 MG tablet TAKE 1 TABLET BY MOUTH ONCE DAILY FOR 90 DAYS    . rosuvastatin (CRESTOR) 20 MG tablet TAKE 1 TABLET BY MOUTH ONCE DAILY FOR 90 DAYS    . clopidogrel (PLAVIX) 75 MG tablet Take 1 tablet (75 mg total) by mouth daily. 30 tablet 6   No current facility-administered medications for this visit.     REVIEW OF SYSTEMS:   [X]  denotes positive finding, [ ]  denotes negative finding Cardiac  Comments:  Chest pain or chest pressure:    Shortness of breath upon exertion:    Short of breath when lying flat:    Irregular heart rhythm:        Vascular    Pain in calf, thigh, or hip brought on by ambulation:    Pain in feet at night that wakes you up from your sleep:     Blood clot in your veins:    Leg swelling:         Pulmonary    Oxygen at home:    Productive cough:     Wheezing:         Neurologic    Sudden weakness in arms or legs:     Sudden numbness in arms or legs:     Sudden onset of difficulty speaking or slurred speech:    Temporary loss of vision in one eye:     Problems with dizziness:         Gastrointestinal    Blood in stool:     Vomited blood:         Genitourinary    Burning when urinating:     Blood in urine:        Psychiatric    Major depression:         Hematologic    Bleeding problems:    Problems with blood clotting too easily:        Skin    Rashes or ulcers:        Constitutional    Fever or chills:      PHYSICAL EXAM:    Vitals:   01/18/19 1210  BP: 132/74  Pulse: 63  Temp: 99.1 F (37.3 C)  TempSrc: Temporal  SpO2: 95%  Weight: 208 lb 6.4 oz (94.5 kg)  Height: 5\' 4"  (1.626 m)    GENERAL: The patient is a well-nourished male, in no acute distress. The vital signs are documented above. CARDIAC: There is a regular rate and rhythm.  VASCULAR:  Right carotid bruit Radial pulse 2+ palpable bilateral upper extremities PULMONARY: There is good air exchange bilaterally without wheezing or rales. ABDOMEN: Soft and non-tender with normal pitched bowel sounds.  MUSCULOSKELETAL: There are no major deformities or cyanosis. NEUROLOGIC: No focal weakness or paresthesias are detected.  Cn II-XII grossly intact.   DATA:   MRI brain 12/23/2018: No acute  intracranial abnormality.  Carotid  duplex here in clinic shows a greater than 80% right ICA stenosis with velocity of 705/283. Duplex prior to clinic showed <50% L ICA stenosis.  Assessment/Plan:  68 year old male that presents with symptomatic high-grade right carotid stenosis.  I discussed in detail that I think his right eye vision loss in May was amaurosis fugax with atheroembolic event from his carotid lesion.  I recommended right carotid endarterectomy for stroke risk reduction.  Surgery was discussed in detail as well as risks including bleeding, cranial nerve injury, and risk of intraoperative stroke. etc.  Patient seems somewhat surprised with my recommendation and was not expecting to need surgery today.  He is amendable to proceed and we will schedule him for next available date.  I did offer him this Thursday but he wants to do it next week to give him time to get his affairs in order.  I did refill his Plavix.  I instructed him to remain on dual antiplatelet therapy with aspirin Plavix.   Marty Heck, MD [facility] Office: [phone] Pager: [phone]

## 2019-01-27 NOTE — Plan of Care (Signed)
  Problem: Education: Goal: Knowledge of discharge needs will improve Outcome: Progressing   Problem: Education: Goal: Knowledge of General Education information will improve Description: Including pain rating scale, medication(s)/side effects and non-pharmacologic comfort measures Outcome: Progressing

## 2019-01-27 NOTE — Op Note (Signed)
OPERATIVE NOTE  PROCEDURE:   1.  right carotid endarterectomy with bovine patch angioplasty 2.  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  Ultimately I did not have room to place a intravascular shunt safely.  The patch was subsequently sewn fairly distal onto the ICA.  At the completion of the case there was no evidence of intimal flap on intraoperative carotid ultrasound.  Both the internal carotid and external carotid artery had appropriate Doppler signals as well.   SPECIMEN(S):  Carotid plaque (sent to Pathology)  INDICATIONS:   Eric Newman is a 68 y.o. male who presents with right symptomatic carotid stenosis >90%.  I discussed with the patient the risks, benefits, and alternatives to carotid endarterectomy.  I discussed the procedural details of carotid endarterectomy with the patient.  The patient is aware that the risks of carotid endarterectomy include but are not limited to: bleeding, infection, stroke, myocardial infarction, death, cranial nerve injuries both temporary and permanent, neck hematoma, possible airway compromise, labile blood pressure post-operatively, cerebral hyperperfusion syndrome, and possible need for additional interventions in the future. The patient is aware of the risks and agrees to proceed forward with the procedure.  DESCRIPTION: After full informed written consent was obtained from the patient, the patient was brought back to the operating room and placed supine upon the operating table.  Prior to induction, the patient received IV antibiotics.   After obtaining adequate anesthesia, the patient was placed into semi-Fowler position with a shoulder roll in place and the patient's neck slightly hyperextended and rotated away from the surgical site.  The patient was prepped in the standard fashion for a right carotid endarterectomy.  I made an incision anterior to the sternocleidomastoid muscle and dissected down through the subcutaneous tissue.  The platysma was opened with electrocautery.  I then used Bovie cautery and blunt dissection to dissect through the underlying platysma and to mobilize the anterior border of the sternocleidomastoid as well as the internal jugular vein laterally.  The facial vein was ligated with 3-0 silk and surgical clips and divided.  After identifying the carotid artery I used Metzenbaum scissors to bluntly dissect the common carotid artery and then controlled this with a umbilical tape.  At this point in time the patient was given 100 units/kg of IV heparin and we checked an ACT to ensure it was greater than 250.  I then carried my dissection cephalad and mobilized the external carotid artery and superior thyroid artery and controlled each of these with a vessel loop.  I then dissected out the internal carotid artery well past the distal plaque.  The internal carotid artery was then controlled with a umbilical tape as well. I was careful to identify the vagus nerve between the internal jugular and common carotid and this was presereved.  I was also careful to identify and preserve the hypoglossal nerve and this was preserved.    Once our ACT was confirmed, I proceeded by clamping the internal carotid artery with a angled bulldog clamp first.  The proximal common carotid artery was controlled with a angled debakey clamp.  The external carotid was controlled with a vessel  loops.  I subsequently opened the common carotid artery with an 11 blade scalpel in longitudinal fashion and extended the arteriotomy with Potts scissors onto the  ICA past the distal plaque.  I then used a Garment/textile technologist and performed a endarterectomy starting in the common carotid artery.  The external carotid artery was endarterectomized with an eversion technique and I was careful to feather the distal ICA plaque.  It became apparent that there was plaque on the posterior wall of the internal carotid artery that extended fairly distal well beyond the clamp site.  As a result I then mobilized the hypoglossal nerve medial with Metzenbaum scissors until I was under the digastric.  I then had to use appendiceal retractor for added visualization.  I moved my angled bulldog clamp higher on the internal carotid artery.  I then used Potts scissors and opened the ICA more distal.  As far as I could see the plaque still extended on the posterior wall and I did not see a nice place to get a good endpoint.  As a result I moved my clamp as high as I could visualize and ultimately continued to open the internal carotid early artery until I was very high at this point.  I did not feel I had room to place a shunt.  Finally I was able to feather the plaque distally where I was satisfied with a nice distal endpoint.  When I released the clamp in the ICA there was excellent backbleeding.  The endarterectomy site was then flushed with heparinized saline and I was careful to ensure there were no flaps in the endarterectomy site.  I did place one 7-0 prolene tacking sutures on the distal endpoint.  I then brought a bovine carotid patch on the field and this was sewn in place with a running anastomosis using a 6-0 Prolene distally and a 5-0 proximal.  The bovine patch was trimmed accordingly.  The artery was flushed antegrade and retrograde prior to completion of the patch.  Once the patch was complete, I flushed up the external carotid artery first prior to releasing the internal carotid artery clamp.  An intraoperative duplex was performed that showed no evidence of intimal flap.  The  internal/external carotid arteries had appropriate Doppler signals as well.  40 mg protamine was given for reversal.  The wound was copiously irrigated.  Surgicel snow was used for hemostasis.Marland Kitchen  Ultimately the platysma was closed in running fashion with 3-0 Vicryl.  The skin was closed with a running 4-0 Monocryl.  Dermabond was applied with a dry sterile dressing.  The patient was awakened from anesthesia with no new neurological deficit and taken to PACU in stable condition.    COMPLICATIONS: None  CONDITION: Stable  Marty Heck, MD [facility] [place] Office: [phone] Pager: [phone]  01/27/2019, 2:52 PM

## 2019-01-27 NOTE — Transfer of Care (Signed)
Immediate Anesthesia Transfer of Care Note  Patient: Eric Newman  Procedure(s) Performed: ENDARTERECTOMY CAROTID RIGHT (Right Neck)  Patient Location: PACU  Anesthesia Type:General  Level of Consciousness: awake, alert , oriented and patient cooperative  Airway & Oxygen Therapy: Patient Spontanous Breathing and Patient connected to nasal cannula oxygen  Post-op Assessment: Report given to RN and Post -op Vital signs reviewed and stable  Post vital signs: Reviewed  Last Vitals:  Vitals Value Taken Time  BP 105/61 01/27/19 1527  Temp    Pulse 58 01/27/19 1536  Resp 15 01/27/19 1536  SpO2 94 % 01/27/19 1536  Vitals shown include unvalidated device data.  Last Pain:  Vitals:   01/27/19 1530  TempSrc:   PainSc: (P) 0-No pain      Patients Stated Pain Goal: 2 (24/19/91 4445)  Complications: No apparent anesthesia complications

## 2019-01-28 LAB — CBC
HCT: 38.5 % — ABNORMAL LOW (ref 39.0–52.0)
Hemoglobin: 13.1 g/dL (ref 13.0–17.0)
MCH: 33 pg (ref 26.0–34.0)
MCHC: 34 g/dL (ref 30.0–36.0)
MCV: 97 fL (ref 80.0–100.0)
Platelets: 240 10*3/uL (ref 150–400)
RBC: 3.97 MIL/uL — ABNORMAL LOW (ref 4.22–5.81)
RDW: 12.4 % (ref 11.5–15.5)
WBC: 14.2 10*3/uL — ABNORMAL HIGH (ref 4.0–10.5)
nRBC: 0 % (ref 0.0–0.2)

## 2019-01-28 LAB — BASIC METABOLIC PANEL
Anion gap: 9 (ref 5–15)
BUN: 29 mg/dL — ABNORMAL HIGH (ref 8–23)
CO2: 22 mmol/L (ref 22–32)
Calcium: 8.4 mg/dL — ABNORMAL LOW (ref 8.9–10.3)
Chloride: 104 mmol/L (ref 98–111)
Creatinine, Ser: 1.31 mg/dL — ABNORMAL HIGH (ref 0.61–1.24)
GFR calc Af Amer: >60 mL/min (ref >60)
GFR calc non Af Amer: 56 mL/min — ABNORMAL LOW (ref >60)
Glucose, Bld: 131 mg/dL — ABNORMAL HIGH (ref 70–99)
Potassium: 4.2 mmol/L (ref 3.5–5.1)
Sodium: 135 mmol/L (ref 135–145)

## 2019-01-28 MED ORDER — OXYCODONE-ACETAMINOPHEN 5-325 MG PO TABS: 1.0000 | ORAL_TABLET | Freq: Four times a day (QID) | ORAL | 0 refills | Status: DC | PRN

## 2019-01-28 NOTE — Progress Notes (Signed)
Patient is discharged home on self. Discharge instruction given to patient, and education was done on Percocet. Patient verbalizes understanding.

## 2019-01-28 NOTE — Discharge Instructions (Signed)
[facility]  Discharge Instructions   Carotid Endarterectomy (CEA)  Please refer to the following instructions for your post-procedure care. Your surgeon or physician assistant will discuss any changes with you.  Activity  You are encouraged to walk as much as you can. You can slowly return to normal activities but must avoid strenuous activity and heavy lifting until your doctor tell you it's OK. Avoid activities such as vacuuming or swinging a golf club. You can drive after one week if you are comfortable and you are no longer taking prescription pain medications. It is normal to feel tired for serval weeks after your surgery. It is also normal to have difficulty with sleep habits, eating, and bowel movements after surgery. These will go away with time.  Bathing/Showering  You may shower after you come home. Do not soak in a bathtub, hot tub, or swim until the incision heals completely.  Incision Care  Shower every day. Clean your incision with mild soap and water. Pat the area dry with a clean towel. You do not need a bandage unless otherwise instructed. Do not apply any ointments or creams to your incision. You may have skin glue on your incision. Do not peel it off. It will come off on its own in about one week. Your incision may feel thickened and raised for several weeks after your surgery. This is normal and the skin will soften over time. For Men Only: It's OK to shave around the incision but do not shave the incision itself for 2 weeks. It is common to have numbness under your chin that could last for several months.  Diet  Resume your normal diet. There are no special food restrictions following this procedure. A low fat/low cholesterol diet is recommended for all patients with vascular disease. In order to heal from your surgery, it is CRITICAL to get adequate nutrition. Your body requires vitamins, minerals, and protein. Vegetables are the best  source of vitamins and minerals. Vegetables also provide the perfect balance of protein. Processed food has little nutritional value, so try to avoid this.    Medications  Resume taking all of your medications unless your doctor or physician assistant tells you not to. If your incision is causing pain, you may take over-the- counter pain relievers such as acetaminophen (Tylenol). If you were prescribed a stronger pain medication, please be aware these medications can cause nausea and constipation. Prevent nausea by taking the medication with a snack or meal. Avoid constipation by drinking plenty of fluids and eating foods with a high amount of fiber, such as fruits, vegetables, and grains.  Do not take Tylenol if you are taking prescription pain medications.  Hold taking your lisinopril/HCTZ until your blood pressure has improved to 962 systolic (top number of blood pressure).  Follow Up  Our office will schedule a follow up appointment 2-3 weeks following discharge.  Please call us immediately for any of the following conditions  Increased pain, redness, drainage (pus) from your incision site. Fever of 101 degrees or higher. If you should develop stroke (slurred speech, difficulty swallowing, weakness on one side of your body, loss of vision) you should call 911 and go to the nearest emergency room.  Reduce your risk of vascular disease:  Stop smoking. If you would like help call [facility] at 1-800-QUIT-NOW 912-878-8351) or [facility] at [phone]. Manage your cholesterol Maintain a desired weight Control your diabetes Keep your blood pressure down  If you have  any questions, please call the office at [phone].

## 2019-01-28 NOTE — Progress Notes (Addendum)
  Progress Note    01/28/2019 7:21 AM 1 Day Post-Op  Subjective:  "I feel top-notch..when can I go home?"  No difficulty swallowing; has ambulated and voided.  Afebrile HR 40's-50's SB 976'B-341'P systolic 37% RA  Vitals:   01/27/19 2006 01/28/19 0416  BP: (!) 100/55 (!) 118/56  Pulse: 60 60  Resp: 19 15  Temp: 97.8 F (36.6 C) (!) 97.5 F (36.4 C)  SpO2: 100% 98%     Physical Exam: Neuro:  In tact; tongue is midline Lungs:  Non labored Incision:  Clean and dry  CBC    Component Value Date/Time   WBC 14.2 (H) 01/28/2019 0521   RBC 3.97 (L) 01/28/2019 0521   HGB 13.1 01/28/2019 0521   HCT 38.5 (L) 01/28/2019 0521   PLT 240 01/28/2019 0521   MCV 97.0 01/28/2019 0521   MCH 33.0 01/28/2019 0521   MCHC 34.0 01/28/2019 0521   RDW 12.4 01/28/2019 0521    BMET    Component Value Date/Time   NA 135 01/28/2019 0521   K 4.2 01/28/2019 0521   CL 104 01/28/2019 0521   CO2 22 01/28/2019 0521   GLUCOSE 131 (H) 01/28/2019 0521   BUN 29 (H) 01/28/2019 0521   CREATININE 1.31 (H) 01/28/2019 0521   CALCIUM 8.4 (L) 01/28/2019 0521   GFRNONAA 56 (L) 01/28/2019 0521   GFRAA >60 01/28/2019 0521     Intake/Output Summary (Last 24 hours) at 01/28/2019 0721 Last data filed at 01/27/2019 1630 Gross per 24 hour  Intake 1930 ml  Output 50 ml  Net 1880 ml     Assessment/Plan:  This is a 68 y.o. male who is s/p right CEA 1 Day Post-Op  -pt is doing well this am.  Pt has bradycardia-pt is not on BB.  BP is soft-will hold ACEI/HCTZ until improved -pt neuro exam is in tact -pt has ambulated -pt has voided -f/u with Dr. Carlis Abbott in 2-3 weeks.   Leontine Locket, PA-C [facility] [phone]  I have seen and evaluated the patient. I agree with the PA note as documented above. Doing well POD#1 s/p R CEA.  OK for discharge home today.  Neuro intact.  Neck looks good.  Will arrange wound check in 3 weeks in clinic.  Next carotid duplex in 9 months for  surveillance.  Marty Heck, MD [facility] [place] Office: [phone] Pager: [phone]

## 2019-01-28 NOTE — Plan of Care (Signed)
  Problem: Education: Goal: Knowledge of discharge needs will improve Outcome: Adequate for Discharge   Problem: Clinical Measurements: Goal: Postoperative complications will be avoided or minimized Outcome: Adequate for Discharge   Problem: Respiratory: Goal: Ability to achieve and maintain a regular respiratory rate will improve Outcome: Adequate for Discharge   Problem: Skin Integrity: Goal: Demonstration of wound healing without infection will improve Outcome: Adequate for Discharge   Problem: Education: Goal: Knowledge of General Education information will improve Description: Including pain rating scale, medication(s)/side effects and non-pharmacologic comfort measures Outcome: Adequate for Discharge   Problem: Health Behavior/Discharge Planning: Goal: Ability to manage health-related needs will improve Outcome: Adequate for Discharge   Problem: Clinical Measurements: Goal: Ability to maintain clinical measurements within normal limits will improve Outcome: Adequate for Discharge Goal: Will remain free from infection Outcome: Adequate for Discharge Goal: Diagnostic test results will improve Outcome: Adequate for Discharge Goal: Respiratory complications will improve Outcome: Adequate for Discharge Goal: Cardiovascular complication will be avoided Outcome: Adequate for Discharge   Problem: Activity: Goal: Risk for activity intolerance will decrease Outcome: Adequate for Discharge   Problem: Nutrition: Goal: Adequate nutrition will be maintained Outcome: Adequate for Discharge   Problem: Coping: Goal: Level of anxiety will decrease Outcome: Adequate for Discharge   Problem: Elimination: Goal: Will not experience complications related to bowel motility Outcome: Adequate for Discharge Goal: Will not experience complications related to urinary retention Outcome: Adequate for Discharge   Problem: Pain Managment: Goal: General experience of comfort will  improve Outcome: Adequate for Discharge   Problem: Safety: Goal: Ability to remain free from injury will improve Outcome: Adequate for Discharge   Problem: Skin Integrity: Goal: Risk for impaired skin integrity will decrease Outcome: Adequate for Discharge   

## 2019-01-28 NOTE — Discharge Summary (Signed)
Discharge Summary     Eric Newman [birth year] 68 y.o. male  [id number]  Admission Date: 01/27/2019  Discharge Date: 01/28/2019  Physician: Marty Heck, MD  Admission Diagnosis: RIGHT CAROTID ARTERY STENOSIS   HPI:   This is a 68 y.o. male with history of hypertension thatpresents for evaluation of right carotid stenosis. Patient states he had an event in early May when he lost vision suddenly in his right eye for approximately 3 minutes. States it was very similar toa shade coming down over his visual field. Ultimately went and saw his primary care doctor who sent him to an ophthalmologist and he states he had a good checkup. An MRI of his brain was done that showed no acute intracranial abnormality. He did have a carotid duplex that showed greater than 70% right carotid stenosis. Patient states he has been on Plavixper his PCP. He has had no recurrent events. When he had his vision loss he had no unilateral arm or leg symptoms otherwise. He denies any previous head or neck surgery. 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  Hospital Course:  The patient was admitted to the hospital and taken to the operating room on 01/27/2019 and underwent right carotid endarterectomy.    Findings: High-grade right internal carotid artery stenosis with plaque that extended very high into the mid ICA requiring mobilization of the hypoglossal nerve.  Ultimately I did not have room to place a intravascular shunt safely.  The patch was subsequently sewn fairly distal onto the ICA.  At the completion of the case there was no evidence of intimal flap on intraoperative carotid ultrasound.  Both the internal carotid and external carotid artery had appropriate Doppler signals as well.  The pt tolerated the procedure well and was transported to the PACU in good condition.   By POD 1, the pt neuro status was in tact.  He was doing well.  Pain was controlled, pt ambulated,  no difficulty swallowing. He is discharged home.   The remainder of the hospital course consisted of increasing mobilization and increasing intake of solids without difficulty.   Recent Labs    01/28/19 0521  NA 135  K 4.2  CL 104  CO2 22  GLUCOSE 131*  BUN 29*  CALCIUM 8.4*   Recent Labs    01/28/19 0521  WBC 14.2*  HGB 13.1  HCT 38.5*  PLT 240   No results for input(s): INR in the last 72 hours.   Discharge Instructions    Discharge patient   Complete by: As directed    Discharge home after Dr. Carlis Abbott has seen pt and he has eaten breakfast.   Discharge disposition: 01-Home or Self Care   Discharge patient date: 01/28/2019      Discharge Diagnosis:  RIGHT CAROTID ARTERY STENOSIS  Secondary Diagnosis: Patient Active Problem List   Diagnosis Date Noted  . Carotid artery stenosis 01/27/2019  . Carotid stenosis 01/18/2019   Past Medical History:  Diagnosis Date  . Hypertension     Allergies as of 01/28/2019   No Known Allergies     Medication List    TAKE these medications   aspirin EC 81 MG tablet Take 81 mg by mouth every evening.   clopidogrel 75 MG tablet Commonly known as: PLAVIX Take 1 tablet (75 mg total) by mouth daily.   famotidine 10 MG tablet Commonly known as: PEPCID Take 10 mg by mouth 2 (two) times daily.   lisinopril-hydrochlorothiazide 10-12.5 MG tablet Commonly  known as: ZESTORETIC Take 1 tablet by mouth daily.   omeprazole 20 MG capsule Commonly known as: PRILOSEC Take 20 mg by mouth every evening.   oxyCODONE-acetaminophen 5-325 MG tablet Commonly known as: Percocet Take 1 tablet by mouth every 6 (six) hours as needed for severe pain.   rosuvastatin 20 MG tablet Commonly known as: CRESTOR Take 20 mg by mouth every evening.        [facility] Discharge Instructions Carotid Endarterectomy (CEA)  Please refer to the following instructions for your post-procedure care. Your surgeon or  physician assistant will discuss any changes with you.  Activity  You are encouraged to walk as much as you can. You can slowly return to normal activities but must avoid strenuous activity and heavy lifting until your doctor tell you it's OK. Avoid activities such as vacuuming or swinging a golf club. You can drive after one week if you are comfortable and you are no longer taking prescription pain medications. It is normal to feel tired for serval weeks after your surgery. It is also normal to have difficulty with sleep habits, eating, and bowel movements after surgery. These will go away with time.  Bathing/Showering  You may shower after you come home. Do not soak in a bathtub, hot tub, or swim until the incision heals completely.  Incision Care  Shower every day. Clean your incision with mild soap and water. Pat the area dry with a clean towel. You do not need a bandage unless otherwise instructed. Do not apply any ointments or creams to your incision. You may have skin glue on your incision. Do not peel it off. It will come off on its own in about one week. Your incision may feel thickened and raised for several weeks after your surgery. This is normal and the skin will soften over time. For Men Only: It's OK to shave around the incision but do not shave the incision itself for 2 weeks. It is common to have numbness under your chin that could last for several months.  Diet  Resume your normal diet. There are no special food restrictions following this procedure. A low fat/low cholesterol diet is recommended for all patients with vascular disease. In order to heal from your surgery, it is CRITICAL to get adequate nutrition. Your body requires vitamins, minerals, and protein. Vegetables are the best source of vitamins and minerals. Vegetables also provide the perfect balance of protein. Processed food has little nutritional value, so try to avoid this.  Medications  Resume taking all of your  medications unless your doctor or physician assistant tells you not to.  If your incision is causing pain, you may take over-the- counter pain relievers such as acetaminophen (Tylenol). If you were prescribed a stronger pain medication, please be aware these medications can cause nausea and constipation.  Prevent nausea by taking the medication with a snack or meal. Avoid constipation by drinking plenty of fluids and eating foods with a high amount of fiber, such as fruits, vegetables, and grains.  Do not take Tylenol if you are taking prescription pain medications.  Hold taking your lisinopril/HCTZ until your blood pressure has improved to 703 systolic (top number of blood pressure).  Follow Up  Our office will schedule a follow up appointment 2-3 weeks following discharge.  Please call us immediately for any of the following conditions  . Increased pain, redness, drainage (pus) from your incision site. . Fever of 101 degrees or higher. Marland Kitchen  If you should develop stroke (slurred speech, difficulty swallowing, weakness on one side of your body, loss of vision) you should call 911 and go to the nearest emergency room. .  Reduce your risk of vascular disease:  . Stop smoking. If you would like help call [facility] at 1-800-QUIT-NOW 224-856-2492) or [place] at ([phone]. . Manage your cholesterol . Maintain a desired weight . Control your diabetes . Keep your blood pressure down .  If you have any questions, please call the office at [phone].  Prescriptions given: 1.   Roxicet #8 No Refill  Disposition: home  Patient's condition: is Good  Follow up: 1. Dr. Carlis Abbott in 2 weeks.   Leontine Locket, PA-C [facility] [phone]   --- For [facility] use ---   Modified Rankin score at D/C (0-6): 0  IV medication needed for:  1. Hypertension: No 2. Hypotension: No  Post-op Complications: No  1. Post-op CVA or TIA: No  If yes: Event  classification (right eye, left eye, right cortical, left cortical, verterobasilar, other): n/a  If yes: Timing of event (intra-op, <6 hrs post-op, >=6 hrs post-op, unknown): n/a  2. CN injury: No  If yes: CN n/a injuried   3. Myocardial infarction: No  If yes: Dx by (EKG or clinical, Troponin): n/a  4.  CHF: No  5.  Dysrhythmia (new): No  6. Wound infection: No  7. Reperfusion symptoms: No  8. Return to OR: No  If yes: return to OR for (bleeding, neurologic, other CEA incision, other): n/a  Discharge medications: Statin use:  Yes ASA use:  Yes   Beta blocker use:  No ACE-Inhibitor use:  Yes  ARB use:  No CCB use: No P2Y12 Antagonist use: Yes, [x ] Plavix, [ ]  Plasugrel, [ ]  Ticlopinine, [ ]  Ticagrelor, [ ]  Other, [ ]  No for medical reason, [ ]  Non-compliant, [ ]  Not-indicated Anti-coagulant use:  No, [ ]  Warfarin, [ ]  Rivaroxaban, [ ]  Dabigatran,

## 2019-01-30 ENCOUNTER — Encounter (HOSPITAL_COMMUNITY): Payer: Self-pay | Admitting: Vascular Surgery

## 2019-02-10 ENCOUNTER — Telehealth (HOSPITAL_COMMUNITY): Payer: Self-pay | Admitting: Rehabilitation

## 2019-02-10 NOTE — Telephone Encounter (Signed)

## 2019-02-14 ENCOUNTER — Other Ambulatory Visit: Payer: Self-pay

## 2019-02-14 ENCOUNTER — Encounter: Payer: Self-pay | Admitting: Family

## 2019-02-14 ENCOUNTER — Ambulatory Visit (INDEPENDENT_AMBULATORY_CARE_PROVIDER_SITE_OTHER): Payer: Self-pay | Admitting: Family

## 2019-02-14 VITALS — BP 124/75 | HR 67 | Temp 98.4°F | Resp 18 | Ht 68.0 in | Wt 209.0 lb

## 2019-02-14 DIAGNOSIS — I6521 Occlusion and stenosis of right carotid artery: Secondary | ICD-10-CM

## 2019-02-14 DIAGNOSIS — Z9889 Other specified postprocedural states: Secondary | ICD-10-CM

## 2019-02-14 NOTE — Progress Notes (Addendum)
Chief Complaint: Follow up Extracranial Carotid Artery Stenosis   History of Present Illness  Eric Newman is a 68 y.o. male who is s/p right CEA on 01-27-19 by Dr. 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  He had transient right amaurosis fugax prior to the CEA, no subsequent neurological events.   He returns today for 2 week post op follow up.   He denies fever or chills. He denies any new neurological symptoms.  He states only minor pain at the right side of his neck, has not needed to use analgesic prescription.   Diabetic: no Tobacco use: former smoker, quit 12-17-18  Pt meds include: Statin : yes ASA: yes Other anticoagulants/antiplatelets: Plavix   Past Medical History:  Diagnosis Date  . Hypertension     Social History Social History   Tobacco Use  . Smoking status: Former Smoker    Quit date: 12/17/2018    Years since quitting: 0.1  . Smokeless tobacco: Never Used  Substance Use Topics  . Alcohol use: Yes    Alcohol/week: 12.0 standard drinks    Types: 12 Cans of beer per week  . Drug use: Never    Family History History reviewed. No pertinent family history.  Surgical History Past Surgical History:  Procedure Laterality Date  . ENDARTERECTOMY Right 01/27/2019   Procedure: ENDARTERECTOMY CAROTID RIGHT;  Surgeon: Marty Heck, MD;  Location: [place];  Service: Vascular;  Laterality: Right;  . PILONIDAL CYST EXCISION  1972    No Known Allergies  Current Outpatient Medications  Medication Sig Dispense Refill  . aspirin EC 81 MG tablet Take 81 mg by mouth every evening.     . clopidogrel (PLAVIX) 75 MG tablet Take 1 tablet (75 mg total) by mouth daily. 30 tablet 6  . famotidine (PEPCID) 10 MG tablet Take 10 mg by mouth 2 (two) times daily.    Marland Kitchen lisinopril-hydrochlorothiazide (ZESTORETIC) 10-12.5 MG tablet Take 1 tablet by mouth daily.     Marland Kitchen omeprazole (PRILOSEC) 20 MG capsule Take 20 mg by mouth every evening.    .  rosuvastatin (CRESTOR) 20 MG tablet Take 20 mg by mouth every evening.     Marland Kitchen oxyCODONE-acetaminophen (PERCOCET) 5-325 MG tablet Take 1 tablet by mouth every 6 (six) hours as needed for severe pain. (Patient not taking: Reported on 02/14/2019) 8 tablet 0   No current facility-administered medications for this visit.     Review of Systems : See HPI for pertinent positives and negatives.  Physical Examination  Vitals:   02/14/19 1412 02/14/19 1416  BP: 125/76 124/75  Pulse: 68 67  Resp: 18   Temp: 98.4 F (36.9 C)   TempSrc: Oral   SpO2: 97%   Weight: 209 lb (94.8 kg)   Height: 5\' 8"  (1.727 m)    Body mass index is 31.78 kg/m.  General: WDWN obese male in NAD GAIT: normal Eyes: PERRLA HENT: No gross abnormalities. Large neck. Right CEA incision healing well with some surgical glue in place, edges well proximated, no erythema, no drainage.  Pulmonary:  Respirations are non-labored, good air movement in all fields, CTAB,   No rales,  Rhonchi, or  wheezing. Cardiac: regular rhythm, no detected murmur.  VASCULAR EXAM Carotid Bruits Right Left   Negative Negative     Abdominal aortic pulse is not palpable. Radial pulses are 2+ palpable and equal.  Gastrointestinal: soft, nontender, BS WNL, no r/g, no palpable masses. Musculoskeletal: no muscle atrophy/wasting. M/S 5/5 throughout, extremities without ischemic changes. Skin: No rashes, no ulcers, no cellulitis.  See HENT.  Neurologic:  A&O X 3; appropriate affect, sensation is normal; speech is normal, CN 2-12 intact, pain and light touch intact in extremities, motor exam as listed above. Psychiatric: Normal thought content, mood appropriate to clinical situation.    Assessment: Eric Newman is a 68 y.o. male who is s/p right CEA on 01-27-19 by Dr. 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  He has not  had any subsequent neurological events. His right CEA incision is healing well. He remains tobacco free.  He continues the daily 81 mg ASA, Plavix, and a statin.  I communicated with Dr. Carlis Abbott about continuation of Plavix: continue for a month, then stop Plavix, and continue taking the 81 mg ASA.    Plan: Follow-up in 5 months with Carotid Duplex scan.   I discussed in depth with the patient the nature of atherosclerosis, and emphasized the importance of maximal medical management including strict control of blood pressure, blood glucose, and lipid levels, obtaining regular exercise, and continued cessation of smoking.  The patient is aware that without maximal medical management the underlying atherosclerotic disease process will progress, limiting the benefit of any interventions. The patient was given information about stroke prevention and what symptoms should prompt the patient to seek immediate medical care. Thank you for allowing [place] to participate in this patient's care.  Clemon Chambers, RN, MSN, FNP-C [facility] Office: [facility] Physician: Trula Slade  02/14/19 2:44 PM

## 2019-02-14 NOTE — Patient Instructions (Signed)

## 2019-09-16 ENCOUNTER — Telehealth (HOSPITAL_COMMUNITY): Payer: Self-pay

## 2019-09-16 NOTE — Telephone Encounter (Signed)

## 2019-09-18 ENCOUNTER — Other Ambulatory Visit: Payer: Self-pay

## 2019-09-18 DIAGNOSIS — I6521 Occlusion and stenosis of right carotid artery: Secondary | ICD-10-CM

## 2019-09-19 ENCOUNTER — Ambulatory Visit (HOSPITAL_COMMUNITY)
Admission: RE | Admit: 2019-09-19 | Discharge: 2019-09-19 | Disposition: A | Discharge status: Home / Self Care | Payer: PPO | Source: Ambulatory Visit | Attending: Surgery | Admitting: Surgery

## 2019-09-19 ENCOUNTER — Other Ambulatory Visit: Payer: Self-pay

## 2019-09-19 ENCOUNTER — Ambulatory Visit: Payer: PPO

## 2019-09-19 DIAGNOSIS — I6521 Occlusion and stenosis of right carotid artery: Secondary | ICD-10-CM | POA: Insufficient documentation

## 2019-10-11 ENCOUNTER — Ambulatory Visit (INDEPENDENT_AMBULATORY_CARE_PROVIDER_SITE_OTHER): Payer: PPO | Admitting: Vascular Surgery

## 2019-10-11 ENCOUNTER — Other Ambulatory Visit: Payer: Self-pay

## 2019-10-11 ENCOUNTER — Ambulatory Visit: Payer: PPO | Admitting: Vascular Surgery

## 2019-10-11 ENCOUNTER — Encounter: Payer: Self-pay | Admitting: Vascular Surgery

## 2019-10-11 VITALS — BP 179/83 | HR 62 | Resp 18 | Ht 68.0 in | Wt 220.0 lb

## 2019-10-11 DIAGNOSIS — I6521 Occlusion and stenosis of right carotid artery: Secondary | ICD-10-CM

## 2019-10-11 NOTE — Progress Notes (Signed)
Patient name: Eric Newman MRN: [id number] DOB: 1951-03-30 Sex: male  REASON FOR VISIT: 41-month follow-up for carotid surveillance  HPI: Eric Newman is a 69 y.o. male with history of hypertension that presents for 67-month follow-up following right carotid endarterectomy.  Patient previously had a right carotid endarterectomy on 01/27/2019 for symptomatic high-grade stenosis greater than 90%.  Since surgery he reports no new neurologic events including vision loss or weakness in arm or leg.  He is taking aspirin and statin daily.  States he quit smoking about 2 weeks before his carotid surgery last year.  No specific concerns at this time otherwise.  Debating getting the Covid vaccine.  Past Medical History:  Diagnosis Date  . Hypertension     Past Surgical History:  Procedure Laterality Date  . ENDARTERECTOMY Right 01/27/2019   Procedure: ENDARTERECTOMY CAROTID RIGHT;  Surgeon: Marty Heck, MD;  Location: [place];  Service: Vascular;  Laterality: Right;  . PILONIDAL CYST EXCISION  1972    History reviewed. No pertinent family history.  SOCIAL HISTORY: Social History   Tobacco Use  . Smoking status: Former Smoker    Quit date: 12/17/2018    Years since quitting: 0.8  . Smokeless tobacco: Never Used  Substance Use Topics  . Alcohol use: Yes    Alcohol/week: 12.0 standard drinks    Types: 12 Cans of beer per week    No Known Allergies  Current Outpatient Medications  Medication Sig Dispense Refill  . aspirin EC 81 MG tablet Take 81 mg by mouth every evening.     Marland Kitchen lisinopril-hydrochlorothiazide (ZESTORETIC) 10-12.5 MG tablet Take 1 tablet by mouth daily.     Marland Kitchen omeprazole (PRILOSEC) 20 MG capsule Take 20 mg by mouth every evening.    . rosuvastatin (CRESTOR) 20 MG tablet Take 20 mg by mouth every evening.     . clopidogrel (PLAVIX) 75 MG tablet Take 1 tablet (75 mg total) by mouth daily. (Patient not taking: Reported on 10/11/2019) 30 tablet 6  . famotidine (PEPCID) 10 MG  tablet Take 10 mg by mouth 2 (two) times daily.    Marland Kitchen oxyCODONE-acetaminophen (PERCOCET) 5-325 MG tablet Take 1 tablet by mouth every 6 (six) hours as needed for severe pain. (Patient not taking: Reported on 02/14/2019) 8 tablet 0   No current facility-administered medications for this visit.    REVIEW OF SYSTEMS:  [X]  denotes positive finding, [ ]  denotes negative finding Cardiac  Comments:  Chest pain or chest pressure:    Shortness of breath upon exertion:    Short of breath when lying flat:    Irregular heart rhythm:        Vascular    Pain in calf, thigh, or hip brought on by ambulation:    Pain in feet at night that wakes you up from your sleep:     Blood clot in your veins:    Leg swelling:         Pulmonary    Oxygen at home:    Productive cough:     Wheezing:         Neurologic    Sudden weakness in arms or legs:     Sudden numbness in arms or legs:     Sudden onset of difficulty speaking or slurred speech:    Temporary loss of vision in one eye:     Problems with dizziness:         Gastrointestinal    Blood in stool:  Vomited blood:         Genitourinary    Burning when urinating:     Blood in urine:        Psychiatric    Major depression:         Hematologic    Bleeding problems:    Problems with blood clotting too easily:        Skin    Rashes or ulcers:        Constitutional    Fever or chills:      PHYSICAL EXAM: Vitals:   10/11/19 0816 10/11/19 0819  BP: (!) 165/74 (!) 179/83  Pulse: 63 62  Resp: 18   SpO2: 95%   Weight: 220 lb (99.8 kg)   Height: 5\' 8"  (1.727 m)     GENERAL: The patient is a well-nourished male, in no acute distress. The vital signs are documented above. CARDIAC: There is a regular rate and rhythm.  VASCULAR:  Right neck incision well healed PULMONARY: There is good air exchange bilaterally without wheezing or rales. ABDOMEN: Soft and non-tender with normal pitched bowel sounds.  MUSCULOSKELETAL: There are no  major deformities or cyanosis. NEUROLOGIC: No focal weakness or paresthesias are detected.  CN II-XII grossly intact. SKIN: There are no ulcers or rashes noted. PSYCHIATRIC: The patient has a normal affect.  DATA:   I independently reviewed his carotid duplex which shows a patent right carotid endarterectomy with only 1 to 39% stenosis and on the left he has a 40 to 59% stenosis.  Assessment/Plan:  69 year old male status post right carotid endarterectomy on 01/27/2019 for symptomatic high-grade stenosis greater than 90%.  On duplex his right carotid endarterectomy site looks patent and no specific concerns at this time.  We are following a 40 to 59% stenosis on the left.  Instructed that in the setting of asymptomatic carotid disease would reserve any intervention on the left until he has more than 80% stenosis.  He will need to continue aspirin and statin which he is doing.  We will see him again in 1 year with carotid duplex for ongoing surveillance.  Instructed him to call with questions or concerns.   Marty Heck, MD [facility] [place] Office: [phone]

## 2019-10-12 ENCOUNTER — Other Ambulatory Visit: Payer: Self-pay | Admitting: *Deleted

## 2019-10-12 DIAGNOSIS — Z9889 Other specified postprocedural states: Secondary | ICD-10-CM

## 2019-10-28 ENCOUNTER — Telehealth: Payer: Self-pay

## 2019-10-28 DIAGNOSIS — Q159 Congenital malformation of eye, unspecified: Secondary | ICD-10-CM | POA: Diagnosis not present

## 2019-10-28 DIAGNOSIS — I10 Essential (primary) hypertension: Secondary | ICD-10-CM | POA: Diagnosis not present

## 2019-10-28 NOTE — Telephone Encounter (Signed)
Pt called with c/o of floaters in left eye since yesterday. He denies any other symptoms; no weakness, no headache, no facial drooping, no slurred speech, no pain and no reduced vision.  He is going to see his PCP this afternoon. He will call us back if anything changes/worsens. We discussed the importance of going immediately to the ED if he looses vision or has any stroke symptoms. Pt verbalized understanding.

## 2019-11-02 DIAGNOSIS — H43812 Vitreous degeneration, left eye: Secondary | ICD-10-CM | POA: Diagnosis not present

## 2019-11-02 DIAGNOSIS — H3562 Retinal hemorrhage, left eye: Secondary | ICD-10-CM | POA: Diagnosis not present

## 2019-11-02 DIAGNOSIS — H43393 Other vitreous opacities, bilateral: Secondary | ICD-10-CM | POA: Diagnosis not present

## 2019-11-28 DIAGNOSIS — H43393 Other vitreous opacities, bilateral: Secondary | ICD-10-CM | POA: Diagnosis not present

## 2019-11-28 DIAGNOSIS — H43812 Vitreous degeneration, left eye: Secondary | ICD-10-CM | POA: Diagnosis not present

## 2019-11-28 DIAGNOSIS — H3562 Retinal hemorrhage, left eye: Secondary | ICD-10-CM | POA: Diagnosis not present

## 2020-01-10 ENCOUNTER — Other Ambulatory Visit (HOSPITAL_COMMUNITY): Payer: Self-pay | Admitting: Family Medicine

## 2020-10-08 DIAGNOSIS — R42 Dizziness and giddiness: Secondary | ICD-10-CM | POA: Diagnosis not present

## 2020-10-08 DIAGNOSIS — I10 Essential (primary) hypertension: Secondary | ICD-10-CM | POA: Diagnosis not present

## 2020-10-08 DIAGNOSIS — I6529 Occlusion and stenosis of unspecified carotid artery: Secondary | ICD-10-CM | POA: Diagnosis not present

## 2020-10-08 DIAGNOSIS — E785 Hyperlipidemia, unspecified: Secondary | ICD-10-CM | POA: Diagnosis not present

## 2020-10-11 DIAGNOSIS — L814 Other melanin hyperpigmentation: Secondary | ICD-10-CM | POA: Diagnosis not present

## 2020-10-11 DIAGNOSIS — D225 Melanocytic nevi of trunk: Secondary | ICD-10-CM | POA: Diagnosis not present

## 2020-10-11 DIAGNOSIS — C44622 Squamous cell carcinoma of skin of right upper limb, including shoulder: Secondary | ICD-10-CM | POA: Diagnosis not present

## 2020-10-11 DIAGNOSIS — L821 Other seborrheic keratosis: Secondary | ICD-10-CM | POA: Diagnosis not present

## 2020-10-11 DIAGNOSIS — Z85828 Personal history of other malignant neoplasm of skin: Secondary | ICD-10-CM | POA: Diagnosis not present

## 2020-11-13 ENCOUNTER — Ambulatory Visit (HOSPITAL_COMMUNITY)
Admission: RE | Admit: 2020-11-13 | Discharge: 2020-11-13 | Disposition: A | Discharge status: Home / Self Care | Payer: PPO | Source: Ambulatory Visit | Attending: Vascular Surgery | Admitting: Vascular Surgery

## 2020-11-13 ENCOUNTER — Encounter: Payer: Self-pay | Admitting: Vascular Surgery

## 2020-11-13 ENCOUNTER — Ambulatory Visit (INDEPENDENT_AMBULATORY_CARE_PROVIDER_SITE_OTHER): Payer: PPO | Admitting: Vascular Surgery

## 2020-11-13 ENCOUNTER — Other Ambulatory Visit: Payer: Self-pay

## 2020-11-13 VITALS — BP 126/75 | HR 72 | Temp 98.1°F | Resp 18 | Ht 68.0 in | Wt 217.0 lb

## 2020-11-13 DIAGNOSIS — Z9889 Other specified postprocedural states: Secondary | ICD-10-CM | POA: Insufficient documentation

## 2020-11-13 DIAGNOSIS — I6521 Occlusion and stenosis of right carotid artery: Secondary | ICD-10-CM

## 2020-11-13 NOTE — Progress Notes (Signed)
Patient name: Eric Newman MRN: [id number] DOB: 1950-08-28 Sex: male  REASON FOR VISIT: 1 year follow-up for carotid artery surveillance  HPI: Eric Newman is a 70 y.o. male with history of hypertension that presents for 1 year follow-up for carotid artery surveillance.  Patient previously had a right carotid endarterectomy on 01/27/2019 for symptomatic high-grade stenosis greater than 90%.  He reports no new issues over the last year.  Has had no neurologic events including weakness or numbness in the arm or leg or vision loss.  Overall feels he is doing well.  Past Medical History:  Diagnosis Date  . Hypertension     Past Surgical History:  Procedure Laterality Date  . ENDARTERECTOMY Right 01/27/2019   Procedure: ENDARTERECTOMY CAROTID RIGHT;  Surgeon: Marty Heck, MD;  Location: [place];  Service: Vascular;  Laterality: Right;  . PILONIDAL CYST EXCISION  1972    History reviewed. No pertinent family history.  SOCIAL HISTORY: Social History   Tobacco Use  . Smoking status: Former Smoker    Quit date: 12/17/2018    Years since quitting: 1.9  . Smokeless tobacco: Never Used  Substance Use Topics  . Alcohol use: Yes    Alcohol/week: 12.0 standard drinks    Types: 12 Cans of beer per week    No Known Allergies  Current Outpatient Medications  Medication Sig Dispense Refill  . aspirin EC 81 MG tablet Take 81 mg by mouth every evening.     . famotidine (PEPCID) 10 MG tablet Take 10 mg by mouth 2 (two) times daily.    Marland Kitchen lisinopril-hydrochlorothiazide (ZESTORETIC) 10-12.5 MG tablet Take 1 tablet by mouth daily.     Marland Kitchen omeprazole (PRILOSEC) 20 MG capsule Take 20 mg by mouth every evening.    . rosuvastatin (CRESTOR) 20 MG tablet Take 20 mg by mouth every evening.     . clopidogrel (PLAVIX) 75 MG tablet Take 1 tablet (75 mg total) by mouth daily. (Patient not taking: No sig reported) 30 tablet 6  . oxyCODONE-acetaminophen (PERCOCET) 5-325 MG tablet Take 1 tablet by mouth  every 6 (six) hours as needed for severe pain. (Patient not taking: No sig reported) 8 tablet 0   No current facility-administered medications for this visit.    REVIEW OF SYSTEMS:  [X]  denotes positive finding, [ ]  denotes negative finding Cardiac  Comments:  Chest pain or chest pressure:    Shortness of breath upon exertion:    Short of breath when lying flat:    Irregular heart rhythm:        Vascular    Pain in calf, thigh, or hip brought on by ambulation:    Pain in feet at night that wakes you up from your sleep:     Blood clot in your veins:    Leg swelling:         Pulmonary    Oxygen at home:    Productive cough:     Wheezing:         Neurologic    Sudden weakness in arms or legs:     Sudden numbness in arms or legs:     Sudden onset of difficulty speaking or slurred speech:    Temporary loss of vision in one eye:     Problems with dizziness:         Gastrointestinal    Blood in stool:     Vomited blood:         Genitourinary  Burning when urinating:     Blood in urine:        Psychiatric    Major depression:         Hematologic    Bleeding problems:    Problems with blood clotting too easily:        Skin    Rashes or ulcers:        Constitutional    Fever or chills:      PHYSICAL EXAM: Vitals:   11/13/20 1338 11/13/20 1340  BP: 139/79 126/75  Pulse: 73 72  Resp: 18   Temp: 98.1 F (36.7 C)   TempSrc: Temporal   SpO2: 93%   Weight: 217 lb (98.4 kg)   Height: 5\' 8"  (1.727 m)     GENERAL: The patient is a well-nourished male, in no acute distress. The vital signs are documented above. CARDIAC: There is a regular rate and rhythm.  VASCULAR:  Right neck incision well healed PULMONARY: No respiratory distress. ABDOMEN: Soft and non-tender. MUSCULOSKELETAL: There are no major deformities or cyanosis. NEUROLOGIC: No focal weakness or paresthesias are detected.  CN II-XII grossly intact.   DATA:   I independently reviewed his carotid  duplex which shows a patent right carotid endarterectomy with only 1 to 39% stenosis and on the left he has a 40 to 59% stenosis - stable over past year.  Assessment/Plan:  70 year old male status post right carotid endarterectomy on 01/27/2019 for symptomatic high-grade stenosis greater than 90% that presents for 1 year follow-up for ongoing carotid artery surveillance.  Discussed that his duplex is stable today.  He has minimal disease on the right 1 to 39% stenosis where he had previous carotid endarterectomy.  On the left which is the side we have been watching he has a 40 to 59% stenosis that is stable over the last year.  Discussed that on the left which is an asymptomatic carotid we would only offer surgery for greater than 80%.  He is on appropriate medical risk reduction and we will see him again in 1 year with carotid duplex here in the office.   Marty Heck, MD [facility] [place] Office: [phone]

## 2021-01-19 IMAGING — MR MRI HEAD WITHOUT AND WITH CONTRAST
12 series · 48 of 48 positions shown · IV contrast (multihance)
Comparison: None.

CLINICAL DATA: Transient right vision loss dizziness

Creatinine was obtained on site at [HOSPITAL] at [HOSPITAL].
Results: Creatinine 1.2 mg/dL.
EXAM:
MRI HEAD WITHOUT AND WITH CONTRAST
TECHNIQUE: Multiplanar, multiecho pulse sequences of the brain and surrounding
structures were obtained without and with intravenous contrast.
CONTRAST:  20mL MULTIHANCE GADOBENATE DIMEGLUMINE 529 MG/ML IV SOLN

[Series 2: T1 · sagittal · 5.0mm · 0.47mm/px · 2 of 21 slices shown]
[im 1/21]
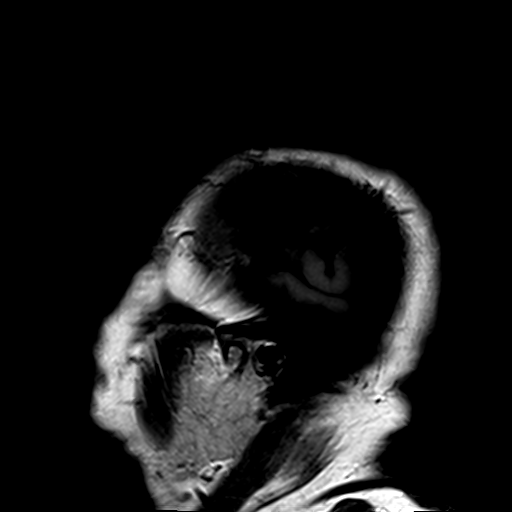
[im 21/21]
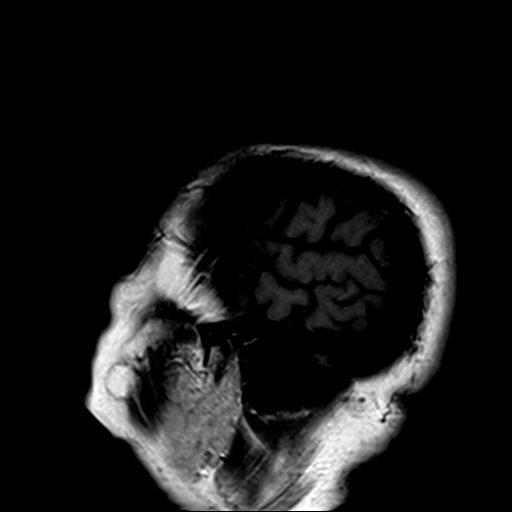

[Series 3: DWI · axial · 3.0mm · 1.80mm/px · z∈[-68,+73]mm · 7 of 108 slices shown (1 of 4)]
[im 1/108]
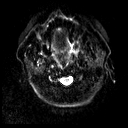
[im 18/108]
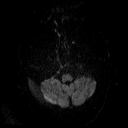
[im 36/108]
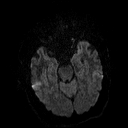
[im 54/108]
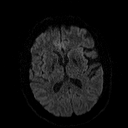
[im 72/108]
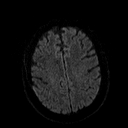
[im 90/108]
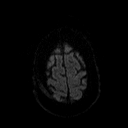
[im 108/108]
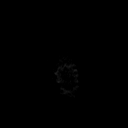

[Series 4: DWI · axial · 3.0mm · 1.80mm/px · z∈[-68,+73]mm · 3 of 53 slices shown (2 of 4)]
[im 1/53]
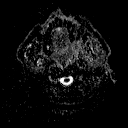
[im 27/53]
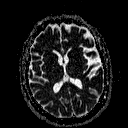
[im 53/53]
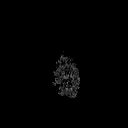

[Series 5: DWI · coronal · 5.0mm · 1.80mm/px · 5 of 75 slices shown (3 of 4)]
[im 1/75]
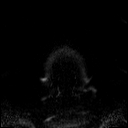
[im 19/75]
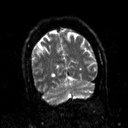
[im 38/75]
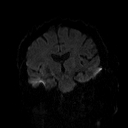
[im 56/75]
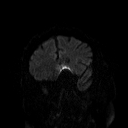
[im 75/75]
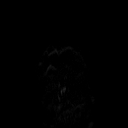

[Series 6: DWI · coronal · 5.0mm · 1.80mm/px · 2 of 38 slices shown (4 of 4)]
[im 1/38]
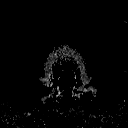
[im 38/38]
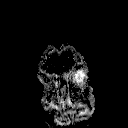

[Series 7: T2 · axial · 5.0mm · 0.51mm/px · z∈[-57,+88]mm · 2 of 26 slices shown (1 of 2)]
[im 1/26]
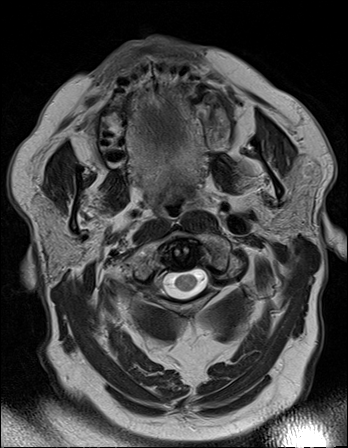
[im 26/26]
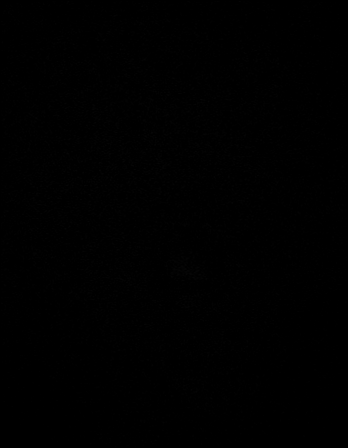

[Series 8: FLAIR · axial · 3.0mm · 0.45mm/px · z∈[-54,+77]mm · 2 of 34 slices shown]
[im 1/34]
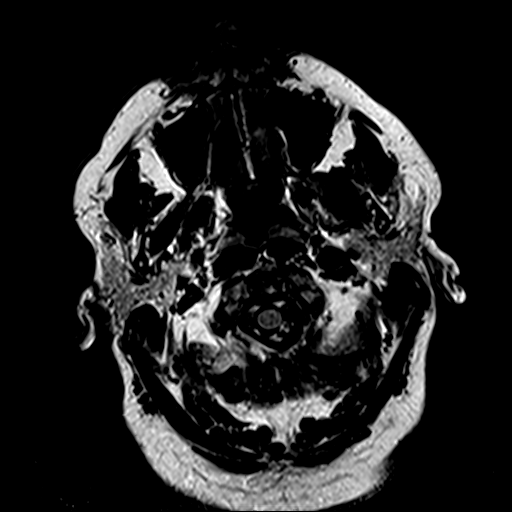
[im 34/34]
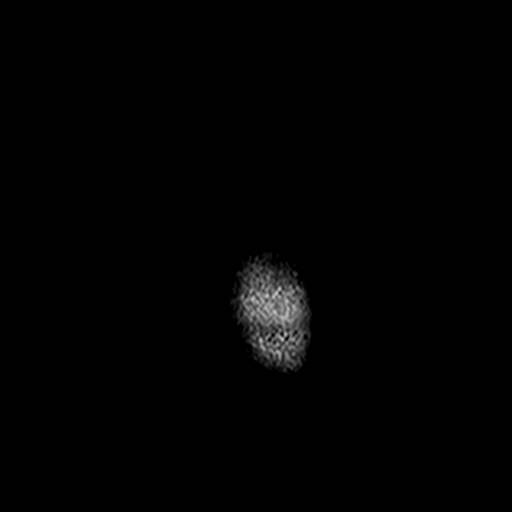

[Series 10: swi_images · axial · 4.0mm · 0.90mm/px · z∈[-62,+86]mm · 3 of 44 slices shown]
[im 1/44]
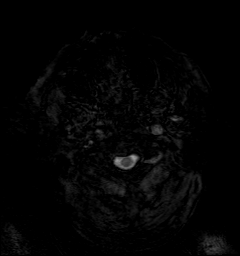
[im 22/44]
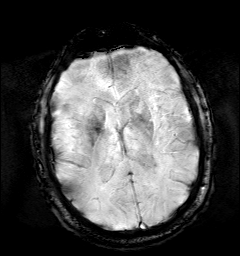
[im 44/44]
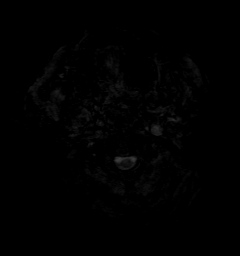

[Series 11: t1_mpr_tra · axial · 1.0mm · 0.75mm/px · z∈[-45,+78]mm · 9 of 144 slices shown (1 of 2)]
[im 1/144]
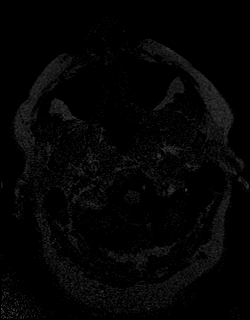
[im 18/144]
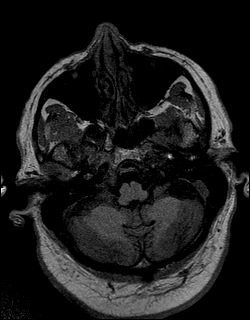
[im 36/144]
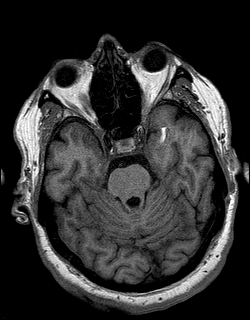
[im 54/144]
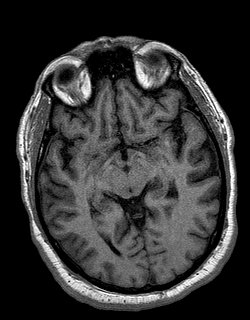
[im 72/144]
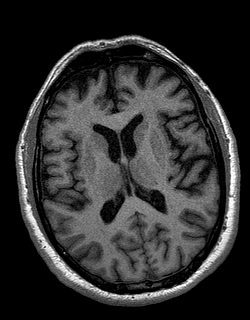
[im 90/144]
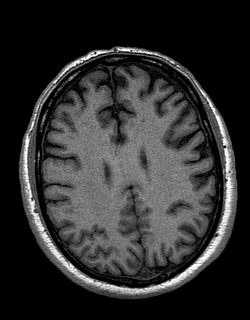
[im 108/144]
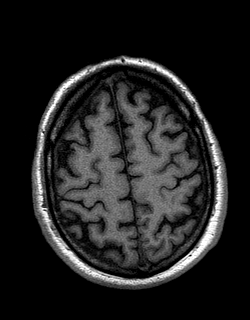
[im 126/144]
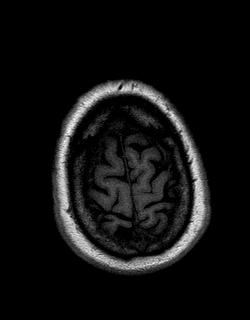
[im 144/144]
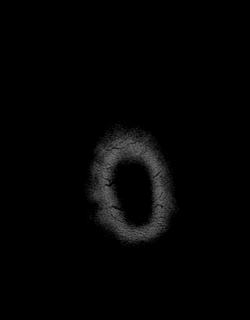

[Series 12: T2 · coronal · 5.0mm · 0.45mm/px · 2 of 30 slices shown (2 of 2)]
[im 1/30]
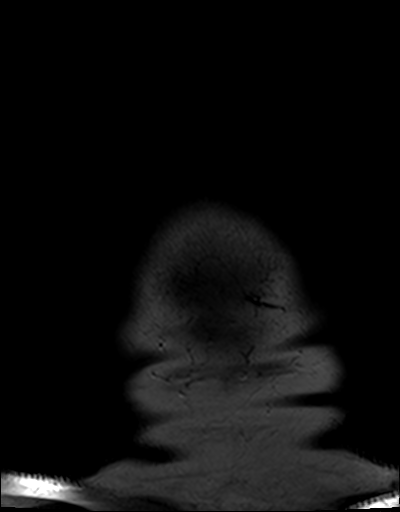
[im 30/30]
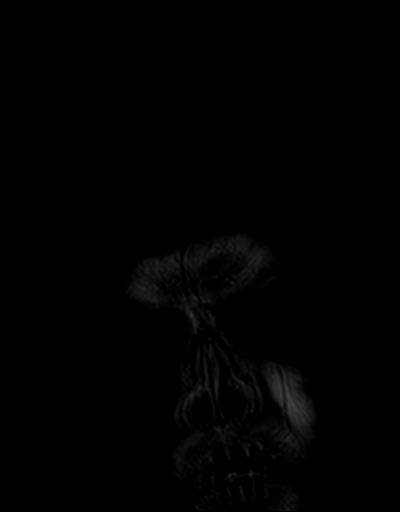

[Series 13: t1_mpr_tra · axial · 1.0mm · 0.75mm/px · z∈[-45,+78]mm · 9 of 144 slices shown (2 of 2)]
[im 1/144]
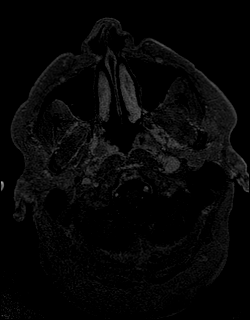
[im 18/144]
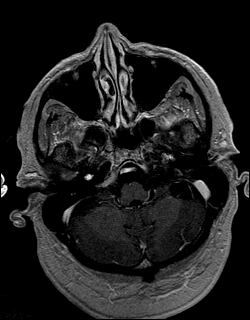
[im 36/144]
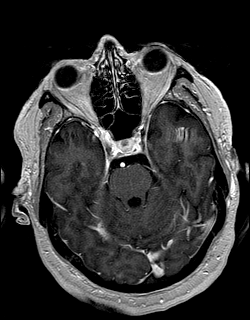
[im 54/144]
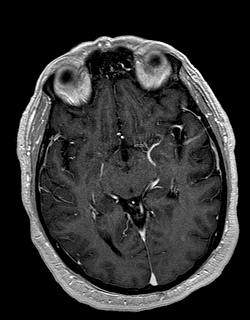
[im 72/144]
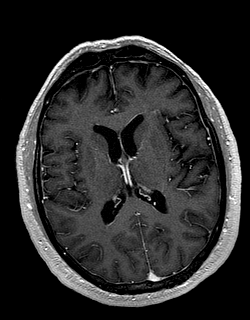
[im 90/144]
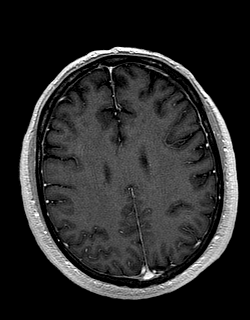
[im 108/144]
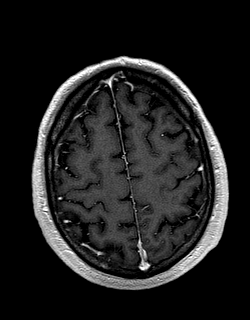
[im 126/144]
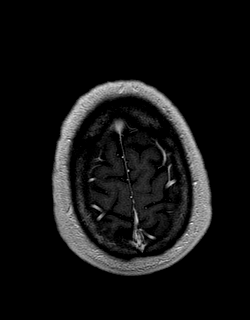
[im 144/144]
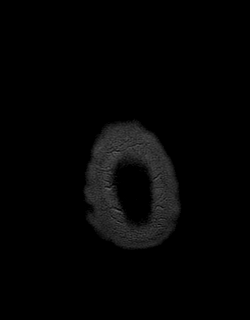

[Series 14: post cor · coronal · 5.0mm · 0.45mm/px · 2 of 30 slices shown]
[im 1/30]
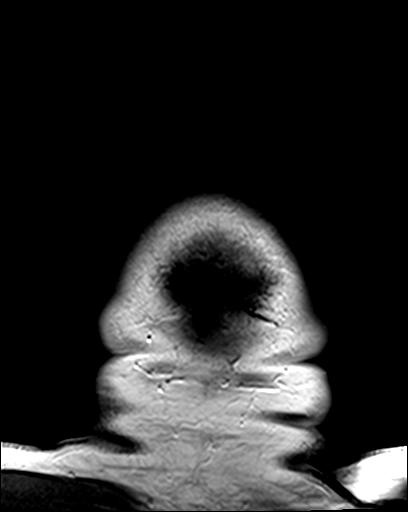
[im 30/30]
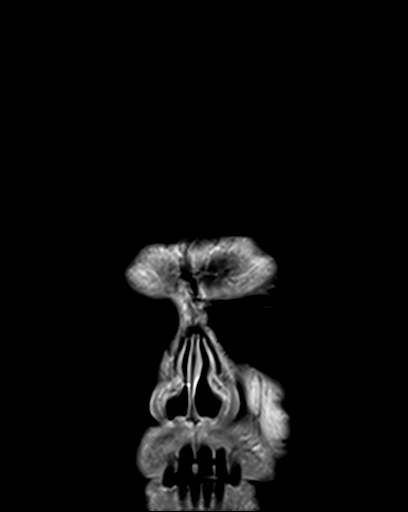

[48 of 48 positions shown; findings below may reference images not displayed]

FINDINGS: Brain: Ventricle size and cerebral volume normal. Negative for acute
infarct. Several small white matter hyperintensities in the deep
white matter bilaterally. Brainstem and cerebellum normal. Negative
for hemorrhage or mass.

Normal enhancement following contrast infusion

Vascular: Normal arterial flow voids

Skull and upper cervical spine: Negative

Sinuses/Orbits: Mild mucosal edema paranasal sinuses. Normal orbit.
No orbital lesion.

Other: None
IMPRESSION: No acute intracranial abnormality. Mild chronic microvascular
ischemic change in the white matter.

## 2021-11-10 ENCOUNTER — Other Ambulatory Visit: Payer: Self-pay

## 2021-11-10 DIAGNOSIS — I6521 Occlusion and stenosis of right carotid artery: Secondary | ICD-10-CM

## 2021-11-19 ENCOUNTER — Ambulatory Visit (INDEPENDENT_AMBULATORY_CARE_PROVIDER_SITE_OTHER): Payer: PPO | Admitting: Vascular Surgery

## 2021-11-19 ENCOUNTER — Ambulatory Visit (HOSPITAL_COMMUNITY)
Admission: RE | Admit: 2021-11-19 | Discharge: 2021-11-19 | Disposition: A | Discharge status: Home / Self Care | Payer: PPO | Source: Ambulatory Visit | Attending: Vascular Surgery | Admitting: Vascular Surgery

## 2021-11-19 ENCOUNTER — Encounter: Payer: Self-pay | Admitting: Vascular Surgery

## 2021-11-19 VITALS — BP 137/76 | HR 62 | Temp 98.6°F | Resp 20 | Ht 68.0 in | Wt 217.0 lb

## 2021-11-19 DIAGNOSIS — I6523 Occlusion and stenosis of bilateral carotid arteries: Secondary | ICD-10-CM | POA: Diagnosis not present

## 2021-11-19 DIAGNOSIS — I6521 Occlusion and stenosis of right carotid artery: Secondary | ICD-10-CM | POA: Diagnosis not present

## 2021-11-19 NOTE — Progress Notes (Signed)
? ?Patient name: Eric Newman MRN: [id number] DOB: May 10, 1951 Sex: male ? ?REASON FOR VISIT: 1 year follow-up for carotid artery surveillance ? ?HPI: ?Eric Newman is a 71 y.o. male with history of hypertension that presents for 1 year follow-up for carotid artery surveillance.  Patient previously had a right carotid endarterectomy on 01/27/2019 for symptomatic high-grade stenosis greater than 90%.  Has has had no neurologic events including weakness or numbness in the arm or leg or vision loss since last follow-up.  He feels when he bends his neck forward he sometimes feels a spasm but this is not associated with any neurologic event.  Has stopped smoking since his carotid. ? ?Past Medical History:  ?Diagnosis Date  ? Hypertension   ? ? ?Past Surgical History:  ?Procedure Laterality Date  ? ENDARTERECTOMY Right 01/27/2019  ? Procedure: ENDARTERECTOMY CAROTID RIGHT;  Surgeon: Marty Heck, MD;  Location: [place];  Service: Vascular;  Laterality: Right;  ? [place]  ? ? ?History reviewed. No pertinent family history. ? ?SOCIAL HISTORY: ?Social History  ? ?Tobacco Use  ? Smoking status: Former  ?  Types: Cigarettes  ?  Quit date: 12/17/2018  ?  Years since quitting: 2.9  ? Smokeless tobacco: Never  ?Substance Use Topics  ? Alcohol use: Yes  ?  Alcohol/week: 12.0 standard drinks  ?  Types: 12 Cans of beer per week  ? ? ?No Known Allergies ? ?Current Outpatient Medications  ?Medication Sig Dispense Refill  ? aspirin EC 81 MG tablet Take 81 mg by mouth every evening.     ? famotidine (PEPCID) 10 MG tablet Take 10 mg by mouth 2 (two) times daily.    ? lisinopril-hydrochlorothiazide (ZESTORETIC) 10-12.5 MG tablet Take 1 tablet by mouth daily.     ? omeprazole (PRILOSEC) 20 MG capsule Take 20 mg by mouth every evening.    ? rosuvastatin (CRESTOR) 20 MG tablet Take 20 mg by mouth every evening.     ? clopidogrel (PLAVIX) 75 MG tablet Take 1 tablet (75 mg total) by mouth daily. (Patient not taking:  Reported on 10/11/2019) 30 tablet 6  ? oxyCODONE-acetaminophen (PERCOCET) 5-325 MG tablet Take 1 tablet by mouth every 6 (six) hours as needed for severe pain. (Patient not taking: Reported on 02/14/2019) 8 tablet 0  ? ?No current facility-administered medications for this visit.  ? ? ?REVIEW OF SYSTEMS:  ?'[X]'$  denotes positive finding, '[ ]'$  denotes negative finding ?Cardiac  Comments:  ?Chest pain or chest pressure:    ?Shortness of breath upon exertion:    ?Short of breath when lying flat:    ?Irregular heart rhythm:    ?    ?Vascular    ?Pain in calf, thigh, or hip brought on by ambulation:    ?Pain in feet at night that wakes you up from your sleep:     ?Blood clot in your veins:    ?Leg swelling:     ?    ?Pulmonary    ?Oxygen at home:    ?Productive cough:     ?Wheezing:     ?    ?Neurologic    ?Sudden weakness in arms or legs:     ?Sudden numbness in arms or legs:     ?Sudden onset of difficulty speaking or slurred speech:    ?Temporary loss of vision in one eye:     ?Problems with dizziness:     ?    ?Gastrointestinal    ?Blood  in stool:     ?Vomited blood:     ?    ?Genitourinary    ?Burning when urinating:     ?Blood in urine:    ?    ?Psychiatric    ?Major depression:     ?    ?Hematologic    ?Bleeding problems:    ?Problems with blood clotting too easily:    ?    ?Skin    ?Rashes or ulcers:    ?    ?Constitutional    ?Fever or chills:    ? ? ?PHYSICAL EXAM: ?Vitals:  ? 11/19/21 1534 11/19/21 1536  ?BP: 134/72 137/76  ?Pulse: 62   ?Resp: 20   ?Temp: 98.6 ?F (37 ?C)   ?SpO2: 93%   ?Weight: 217 lb (98.4 kg)   ?Height: '5\' 8"'$  (1.727 m)   ? ? ?GENERAL: The patient is a well-nourished male, in no acute distress. The vital signs are documented above. ?CARDIAC: There is a regular rate and rhythm.  ?VASCULAR:  ?Previous right neck incision well healed ?PULMONARY: No respiratory distress. ?ABDOMEN: Soft and non-tender. ?MUSCULOSKELETAL: There are no major deformities or cyanosis. ?NEUROLOGIC: No focal weakness or  paresthesias are detected.  CN II-XII grossly intact. ? ? ?DATA:  ? ?I independently reviewed his carotid duplex which shows a patent right carotid endarterectomy with only 1 to 39% stenosis and on the left he has a 40 to 59% stenosis. ? ?Assessment/Plan: ? ?71 year old male status post right carotid endarterectomy on 01/27/2019 for symptomatic high-grade stenosis greater than 90% that presents for 1 year follow-up for ongoing carotid artery surveillance.  Discussed that his duplex is stable again today.  He has minimal disease on the right 1 to 39% stenosis where he had previous carotid endarterectomy.  On the left he also has a stable 40 to 59% stenosis.  I will see him again in one year for continued surveillance. ? ? ?Marty Heck, MD ?[facility] ?Office: [phone] ? ? ? ? ?

## 2022-01-23 DIAGNOSIS — E785 Hyperlipidemia, unspecified: Secondary | ICD-10-CM | POA: Diagnosis not present

## 2022-01-23 DIAGNOSIS — I6529 Occlusion and stenosis of unspecified carotid artery: Secondary | ICD-10-CM | POA: Diagnosis not present

## 2022-01-23 DIAGNOSIS — Z1211 Encounter for screening for malignant neoplasm of colon: Secondary | ICD-10-CM | POA: Diagnosis not present

## 2022-01-23 DIAGNOSIS — R7309 Other abnormal glucose: Secondary | ICD-10-CM | POA: Diagnosis not present

## 2022-01-23 DIAGNOSIS — I10 Essential (primary) hypertension: Secondary | ICD-10-CM | POA: Diagnosis not present

## 2022-05-01 DIAGNOSIS — I499 Cardiac arrhythmia, unspecified: Secondary | ICD-10-CM | POA: Diagnosis not present

## 2022-05-09 ENCOUNTER — Telehealth: Payer: Self-pay

## 2022-05-09 ENCOUNTER — Ambulatory Visit: Payer: PPO | Attending: Family Medicine

## 2022-05-09 ENCOUNTER — Other Ambulatory Visit: Payer: Self-pay

## 2022-05-09 DIAGNOSIS — I499 Cardiac arrhythmia, unspecified: Secondary | ICD-10-CM

## 2022-05-09 NOTE — Telephone Encounter (Signed)
My Chart message to pt

## 2022-05-09 NOTE — Progress Notes (Unsigned)
ZIO XT monitor mailed to pt's home address.

## 2022-05-12 ENCOUNTER — Other Ambulatory Visit: Payer: Self-pay | Admitting: *Deleted

## 2022-05-12 ENCOUNTER — Ambulatory Visit: Payer: PPO | Attending: Family Medicine

## 2022-05-12 DIAGNOSIS — R002 Palpitations: Secondary | ICD-10-CM

## 2022-05-12 DIAGNOSIS — I499 Cardiac arrhythmia, unspecified: Secondary | ICD-10-CM

## 2022-05-12 NOTE — Progress Notes (Unsigned)
Enrolled for [employer] to mail a ZIO XT long term holter monitor to the patients address on file.   DOD to read. 

## 2022-05-15 DIAGNOSIS — I499 Cardiac arrhythmia, unspecified: Secondary | ICD-10-CM | POA: Diagnosis not present

## 2022-05-26 DIAGNOSIS — I499 Cardiac arrhythmia, unspecified: Secondary | ICD-10-CM | POA: Diagnosis not present

## 2022-07-13 ENCOUNTER — Encounter: Payer: Self-pay | Admitting: Cardiovascular Disease

## 2022-07-13 NOTE — Progress Notes (Unsigned)
Cardiology Office Note:    Date:  07/14/2022   ID:  SHO SALGUERO, DOB 1951/01/13, MRN [id number]  PCP:  London Pepper, MD   [facility] Providers Cardiologist:  New to Lyniah Fujita   Referring MD: London Pepper, MD   Chief Complaint  Patient presents with   Hypertension        Hyperlipidemia    History of Present Illness:    Eric Newman is a 71 y.o. male with a hx of carotid artery disease, hypertension, palpitations  Has had palpitations since Aug. - sept. Saw his primary care doctor   Palps - are like a hard heart beath, sometimes just 1 extra heartbeat, sometimes several hard heart beats.  This sensation is uncomfortable but there is no specific chest pain or shortness of breath. No syncope or presyncope  Palpitations were very bad in Sept.   Have generally improved   Only new med change is DC of lisinopril / HCT  Started Amlodipine 10 mg QD and   Valsartan / HCT 80/12.5 QD    Labs from his primary medical doctor include a cholesterol profile: Total cholesterol is 166 Triglyceride level is 88 HDL is 61 LDL is 89   CMP showed K of 4.5   Knows that he quits breathing at night  Is very fatigued  Snores,  HTN No early morning headaches  Some daytime sleepiness.  Will screen  Still eats processed meats   Is scheduled for a colonoscopy on Dec. 21 He is at low risk for his colonoscopy .      Past Medical History:  Diagnosis Date   Carotid artery stenosis    H/O carotid endarterectomy    Hypertension    Irregular heart beat    Occlusion and stenosis of unspecified carotid artery    Palpitations     Past Surgical History:  Procedure Laterality Date   ENDARTERECTOMY Right 01/27/2019   Procedure: ENDARTERECTOMY CAROTID RIGHT;  Surgeon: Marty Heck, MD;  Location: [facility] OR;  Service: Vascular;  Laterality: Right;   PILONIDAL CYST EXCISION  1972    Current Medications: Current Meds  Medication Sig   amLODipine (NORVASC) 10 MG tablet  Take 10 mg by mouth daily.   aspirin EC 81 MG tablet Take 81 mg by mouth every evening.    famotidine (PEPCID) 10 MG tablet Take 10 mg by mouth 2 (two) times daily.   metoprolol tartrate (LOPRESSOR) 25 MG tablet Take 1 tablet (25 mg total) by mouth 2 (two) times daily.   rosuvastatin (CRESTOR) 20 MG tablet Take 20 mg by mouth every evening.    valsartan-hydrochlorothiazide (DIOVAN-HCT) 80-12.5 MG tablet Take 1 tablet by mouth daily.     Allergies:   Patient has no known allergies.   Social History   Socioeconomic History   Marital status: Single    Spouse name: Not on file   Number of children: Not on file   Years of education: Not on file   Highest education level: Not on file  Occupational History   Not on file  Tobacco Use   Smoking status: Former    Types: Cigarettes    Quit date: 12/17/2018    Years since quitting: 3.5   Smokeless tobacco: Never  Substance and Sexual Activity   Alcohol use: Yes    Alcohol/week: 12.0 standard drinks of alcohol    Types: 12 Cans of beer per week   Drug use: Never   Sexual activity: Not on file  Other  Topics Concern   Not on file  Social History Narrative   Not on file   Social Determinants of Health   Financial Resource Strain: Not on file  Food Insecurity: Not on file  Transportation Needs: Not on file  Physical Activity: Not on file  Stress: Not on file  Social Connections: Not on file    Family History: The patient's family history is not on file.  ROS:   Please see the history of present illness.     All other systems reviewed and are negative.  EKGs/Labs/Other Studies Reviewed:    The following studies were reviewed today:   EKG: May 01, 2022: Normal sinus rhythm at 73.  No ST or T wave changes.  Recent Labs: No results found for requested labs within last 365 days.  Recent Lipid Panel No results found for: "CHOL", "TRIG", "HDL", "CHOLHDL", "VLDL", "LDLCALC", "LDLDIRECT"   Risk Assessment/Calculations:         STOP-Bang Score:  6       Physical Exam:    VS:  BP (!) 148/80   Pulse 76   Ht '5\' 8"'$  (1.727 m)   Wt 220 lb 3.2 oz (99.9 kg)   SpO2 94%   BMI 33.48 kg/m     Wt Readings from Last 3 Encounters:  07/14/22 220 lb 3.2 oz (99.9 kg)  11/19/21 217 lb (98.4 kg)  11/13/20 217 lb (98.4 kg)     GEN: moderately obese male,  in no acute distress HEENT: Normal NECK: No JVD; No carotid bruits LYMPHATICS: No lymphadenopathy CARDIAC: RRR, no murmurs, rubs, gallops RESPIRATORY:  Clear to auscultation without rales, wheezing or rhonchi  ABDOMEN: Soft, non-tender, non-distended MUSCULOSKELETAL:  No edema; No deformity  SKIN: Warm and dry NEUROLOGIC:  Alert and oriented x 3 PSYCHIATRIC:  Normal affect   ASSESSMENT:    1. Other fatigue   2. Palpitations   3. Primary hypertension   4. Ventricular tachycardia (HCC)    PLAN:    In order of problems listed above:  Palpitations: Bernal presents for further evaluation management of his palpitations.  He had a monitor that revealed premature ventricular contractions and premature atrial contractions.  He had several episodes of nonsustained ventricular tachycardia as well as nonsustained supraventricular tachycardia. Would like to get an echocardiogram for further assessment of his left ventricular function.  Will start him on metoprolol 25 mg twice a day in addition to his other medications.  2.  Hypertension: Blood pressure remains mildly elevated.  Will add metoprolol.  Will continue to gradually titrate up his medications. I encouraged him to watch his salt intake.  I encouraged him to work on weight loss.  3.  Daytime sleepiness: He has generalized fatigue.  He has been told that he snores and stops breathing at night.  I think he is at high risk for obstructive sleep apnea.  Will schedule him for a home sleep study.           Medication Adjustments/Labs and Tests Ordered: Current medicines are reviewed at length with the patient  today.  Concerns regarding medicines are outlined above.  Orders Placed This Encounter  Procedures   ECHOCARDIOGRAM COMPLETE   Itamar Sleep Study   Meds ordered this encounter  Medications   metoprolol tartrate (LOPRESSOR) 25 MG tablet    Sig: Take 1 tablet (25 mg total) by mouth 2 (two) times daily.    Dispense:  180 tablet    Refill:  3  Patient Instructions  Medication Instructions:  STOP Lisinopril/HCTZ START Metoprolol tartrate '25mg'$  twice daily *If you need a refill on your cardiac medications before your next appointment, please call your pharmacy*   Lab Work: NONE If you have labs (blood work) drawn today and your tests are completely normal, you will receive your results only by: [place] (if you have MyChart) OR A paper copy in the mail If you have any lab test that is abnormal or we need to change your treatment, we will call you to review the results.  Testing/Procedures: ECHO Your physician has requested that you have an echocardiogram. Echocardiography is a painless test that uses sound waves to create images of your heart. It provides your doctor with information about the size and shape of your heart and how well your heart's chambers and valves are working. This procedure takes approximately one hour. There are no restrictions for this procedure. Please do NOT wear cologne, perfume, aftershave, or lotions (deodorant is allowed). Please arrive 15 minutes prior to your appointment time.  Itamar Sleep Study Your physician has recommended that you have a sleep study. This test records several body functions during sleep, including: brain activity, eye movement, oxygen and carbon dioxide blood levels, heart rate and rhythm, breathing rate and rhythm, the flow of air through your mouth and nose, snoring, body muscle movements, and chest and belly movement.  Follow-Up: At [facility], you and your health needs are our priority.  As part of our  continuing mission to provide you with exceptional heart care, we have created designated Provider Care Teams.  These Care Teams include your primary Cardiologist (physician) and Advanced Practice Providers (APPs -  Physician Assistants and Nurse Practitioners) who all work together to provide you with the care you need, when you need it.  Your next appointment:   3 month(s)  The format for your next appointment:   In Person  Provider:   Mertie Moores, MD    Important Information About Sugar         Signed, Mertie Moores, MD  07/14/2022 5:18 PM    [place]

## 2022-07-14 ENCOUNTER — Telehealth: Payer: Self-pay | Admitting: *Deleted

## 2022-07-14 ENCOUNTER — Ambulatory Visit: Payer: PPO | Attending: Cardiovascular Disease | Admitting: Cardiovascular Disease

## 2022-07-14 VITALS — BP 148/80 | HR 76 | Ht 68.0 in | Wt 220.2 lb

## 2022-07-14 DIAGNOSIS — R5383 Other fatigue: Secondary | ICD-10-CM | POA: Diagnosis not present

## 2022-07-14 DIAGNOSIS — I10 Essential (primary) hypertension: Secondary | ICD-10-CM

## 2022-07-14 DIAGNOSIS — I472 Ventricular tachycardia, unspecified: Secondary | ICD-10-CM

## 2022-07-14 DIAGNOSIS — R002 Palpitations: Secondary | ICD-10-CM

## 2022-07-14 MED ORDER — METOPROLOL TARTRATE 25 MG PO TABS: 25.0000 mg | ORAL_TABLET | Freq: Two times a day (BID) | ORAL | 3 refills | Status: DC

## 2022-07-14 NOTE — Patient Instructions (Signed)
Medication Instructions:  STOP Lisinopril/HCTZ START Metoprolol tartrate '25mg'$  twice daily *If you need a refill on your cardiac medications before your next appointment, please call your pharmacy*   Lab Work: NONE If you have labs (blood work) drawn today and your tests are completely normal, you will receive your results only by: [place] (if you have MyChart) OR A paper copy in the mail If you have any lab test that is abnormal or we need to change your treatment, we will call you to review the results.  Testing/Procedures: ECHO Your physician has requested that you have an echocardiogram. Echocardiography is a painless test that uses sound waves to create images of your heart. It provides your doctor with information about the size and shape of your heart and how well your heart's chambers and valves are working. This procedure takes approximately one hour. There are no restrictions for this procedure. Please do NOT wear cologne, perfume, aftershave, or lotions (deodorant is allowed). Please arrive 15 minutes prior to your appointment time.  Itamar Sleep Study Your physician has recommended that you have a sleep study. This test records several body functions during sleep, including: brain activity, eye movement, oxygen and carbon dioxide blood levels, heart rate and rhythm, breathing rate and rhythm, the flow of air through your mouth and nose, snoring, body muscle movements, and chest and belly movement.  Follow-Up: At [facility], you and your health needs are our priority.  As part of our continuing mission to provide you with exceptional heart care, we have created designated Provider Care Teams.  These Care Teams include your primary Cardiologist (physician) and Advanced Practice Providers (APPs -  Physician Assistants and Nurse Practitioners) who all work together to provide you with the care you need, when you need it.  Your next appointment:   3 month(s)  The  format for your next appointment:   In Person  Provider:   Mertie Moores, MD    Important Information About Sugar

## 2022-07-14 NOTE — Telephone Encounter (Signed)
Pt was seen in the office today by Dr. Acie Fredrickson and has been set up with an Itamar study. Pt agreeable to signed waiver and not to open the box until he has been called with the PIN#.

## 2022-07-17 ENCOUNTER — Ambulatory Visit (HOSPITAL_COMMUNITY): Payer: PPO | Attending: Cardiology

## 2022-07-17 DIAGNOSIS — I10 Essential (primary) hypertension: Secondary | ICD-10-CM | POA: Diagnosis not present

## 2022-07-17 DIAGNOSIS — R002 Palpitations: Secondary | ICD-10-CM | POA: Diagnosis not present

## 2022-07-17 DIAGNOSIS — I472 Ventricular tachycardia, unspecified: Secondary | ICD-10-CM | POA: Insufficient documentation

## 2022-07-17 DIAGNOSIS — R5383 Other fatigue: Secondary | ICD-10-CM | POA: Diagnosis not present

## 2022-07-17 LAB — ECHOCARDIOGRAM COMPLETE
AR max vel: 1.02 cm2
AV Area VTI: 1.12 cm2
AV Area mean vel: 0.98 cm2
AV Mean grad: 10 mmHg
AV Peak grad: 17.3 mmHg
Ao pk vel: 2.08 m/s
Area-P 1/2: 3.68 cm2
P 1/2 time: 698 msec
S' Lateral: 3.8 cm

## 2022-07-21 ENCOUNTER — Telehealth: Payer: Self-pay | Admitting: Cardiovascular Disease

## 2022-07-21 DIAGNOSIS — I77819 Aortic ectasia, unspecified site: Secondary | ICD-10-CM

## 2022-07-21 NOTE — Telephone Encounter (Signed)
Called and spoke directly with patient who verbalizes understanding of ECHO results and need to do CTA of aorta in 1 year. Order placed at this time.

## 2022-07-21 NOTE — Telephone Encounter (Signed)
-----   Message from Thayer Headings, MD sent at 07/19/2022  9:46 AM EST ----- Normal LV systolic function. 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

## 2022-07-31 DIAGNOSIS — Z86010 Personal history of colonic polyps: Secondary | ICD-10-CM | POA: Diagnosis not present

## 2022-07-31 DIAGNOSIS — K648 Other hemorrhoids: Secondary | ICD-10-CM | POA: Diagnosis not present

## 2022-07-31 DIAGNOSIS — D125 Benign neoplasm of sigmoid colon: Secondary | ICD-10-CM | POA: Diagnosis not present

## 2022-07-31 DIAGNOSIS — K5730 Diverticulosis of large intestine without perforation or abscess without bleeding: Secondary | ICD-10-CM | POA: Diagnosis not present

## 2022-07-31 DIAGNOSIS — D124 Benign neoplasm of descending colon: Secondary | ICD-10-CM | POA: Diagnosis not present

## 2022-07-31 DIAGNOSIS — Z09 Encounter for follow-up examination after completed treatment for conditions other than malignant neoplasm: Secondary | ICD-10-CM | POA: Diagnosis not present

## 2022-08-01 NOTE — Telephone Encounter (Signed)
Pt has been give PIN# [id number] and ok to proceed with sleep study.   Called and made the patient aware that he may proceed with the [facility] Sleep Study. PIN # provided to the patient. Patient made aware that he will be contacted after the test has been read with the results and any recommendations. Patient verbalized understanding and thanked me for the call.

## 2022-10-16 ENCOUNTER — Encounter: Payer: Self-pay | Admitting: Cardiovascular Disease

## 2022-10-16 NOTE — Progress Notes (Unsigned)
Cardiology Office Note:    Date:  10/17/2022   ID:  Eric Newman, DOB 07-20-1951, MRN [id number]  PCP:  Eric Pepper, MD   [facility] Providers Cardiologist:  New to Eric Newman   Referring MD: Eric Pepper, MD   Chief Complaint  Patient presents with   Hypertension    History of Present Illness:    Eric Newman is a 72 y.o. male with a hx of carotid artery disease, hypertension, palpitations  Has had palpitations since Aug. - sept. Saw his primary care doctor   Palps - are like a hard heart beath, sometimes just 1 extra heartbeat, sometimes several hard heart beats.  This sensation is uncomfortable but there is no specific chest pain or shortness of breath. No syncope or presyncope  Palpitations were very bad in Sept.   Have generally improved   Only new med change is DC of lisinopril / HCT  Started Amlodipine 10 mg QD and   Valsartan / HCT 80/12.5 QD    Labs from his primary medical doctor include a cholesterol profile: Total cholesterol is 166 Triglyceride level is 88 HDL is 61 LDL is 89   CMP showed K of 4.5   Knows that he quits breathing at night  Is very fatigued  Snores,  HTN No early morning headaches  Some daytime sleepiness.  Will screen  Still eats processed meats   Is scheduled for a colonoscopy on Dec. 21 He is at low risk for his colonoscopy .     October 17, 2022 Eric Newman is seen for follow up of his fatigue, HTN, ? OSA  The metoprolol has helped his palpitations  BP remains elevated No longer eating processed meats  Gets some exercise  Still doing wood working .   Takes amlodipine 10 mg  in the morning His med list includes  valsartan - HCT 80-12.5 - he does not recognize this  He will call back today to let us know if he is on it     Past Medical History:  Diagnosis Date   Carotid artery stenosis    H/O carotid endarterectomy    Hypertension    Irregular heart beat    Occlusion and stenosis of unspecified carotid artery     Palpitations     Past Surgical History:  Procedure Laterality Date   ENDARTERECTOMY Right 01/27/2019   Procedure: ENDARTERECTOMY CAROTID RIGHT;  Surgeon: Marty Heck, MD;  Location: [facility] OR;  Service: Vascular;  Laterality: Right;   PILONIDAL CYST EXCISION  1972    Current Medications: No outpatient medications have been marked as taking for the 10/17/22 encounter (Office Visit) with Peg Fifer, Wonda Cheng, MD.     Allergies:   Patient has no known allergies.   Social History   Socioeconomic History   Marital status: Single    Spouse name: Not on file   Number of children: Not on file   Years of education: Not on file   Highest education level: Not on file  Occupational History   Not on file  Tobacco Use   Smoking status: Former    Types: Cigarettes    Quit date: 12/17/2018    Years since quitting: 3.8   Smokeless tobacco: Never  Substance and Sexual Activity   Alcohol use: Yes    Alcohol/week: 12.0 standard drinks of alcohol    Types: 12 Cans of beer per week   Drug use: Never   Sexual activity: Not on file  Other Topics Concern  Not on file  Social History Narrative   Not on file   Social Determinants of Health   Financial Resource Strain: Not on file  Food Insecurity: Not on file  Transportation Needs: Not on file  Physical Activity: Not on file  Stress: Not on file  Social Connections: Not on file    Family History: The patient's family history is not on file.  ROS:   Please see the history of present illness.     All other systems reviewed and are negative.  EKGs/Labs/Other Studies Reviewed:    The following studies were reviewed today:   EKG:   Recent Labs: No results found for requested labs within last 365 days.  Recent Lipid Panel No results found for: "CHOL", "TRIG", "HDL", "CHOLHDL", "VLDL", "LDLCALC", "LDLDIRECT"   Risk Assessment/Calculations:        STOP-Bang Score:  6  { Consider Dx Sleep Disordered Breathing or Sleep Apnea   ICD G47.33          :1}     Physical Exam:    Physical Exam: Blood pressure (!) 142/70, pulse 86, height '5\' 8"'$  (1.727 m), weight 222 lb (100.7 kg), SpO2 92 %.  HYPERTENSION CONTROL Vitals:   10/17/22 0857 10/17/22 0926  BP: (!) 148/84 (!) 142/70    The patient's blood pressure is elevated above target today. {Click here if intervention needs to be changed Refresh Note :1}  In order to address the patient's elevated BP: A current anti-hypertensive medication was adjusted today.       GEN:   moderately obese male, in no acute distress HEENT: Normal NECK: No JVD; No carotid bruits LYMPHATICS: No lymphadenopathy CARDIAC: RRR ***, no murmurs, rubs, gallops RESPIRATORY:  Clear to auscultation without rales, wheezing or rhonchi  ABDOMEN: Soft, non-tender, non-distended MUSCULOSKELETAL:  No edema; No deformity  SKIN: Warm and dry NEUROLOGIC:  Alert and oriented x 3   ASSESSMENT:    1. Primary hypertension     PLAN:      Palpitations: Palpitations are well-controlled on current dose of metoprolol.  2.  Hypertension: Blood pressure is mildly elevated today.  He does not recognize the valsartan.  He will go home and recheck his med list.  I anticipate either increasing his valsartan slightly or restarting it if he is somehow left that off his list  3.  Daytime sleepiness:   Encouraged him to do the home sleep study            Medication Adjustments/Labs and Tests Ordered: Current medicines are reviewed at length with the patient today.  Concerns regarding medicines are outlined above.  No orders of the defined types were placed in this encounter.  No orders of the defined types were placed in this encounter.     There are no Patient Instructions on file for this visit.   Signed, Mertie Moores, MD  10/17/2022 9:36 AM    [place]

## 2022-10-17 ENCOUNTER — Ambulatory Visit: Payer: PPO | Attending: Cardiovascular Disease | Admitting: Cardiovascular Disease

## 2022-10-17 ENCOUNTER — Encounter: Payer: Self-pay | Admitting: Cardiovascular Disease

## 2022-10-17 VITALS — BP 142/70 | HR 86 | Ht 68.0 in | Wt 222.0 lb

## 2022-10-17 DIAGNOSIS — I10 Essential (primary) hypertension: Secondary | ICD-10-CM | POA: Diagnosis not present

## 2022-10-17 DIAGNOSIS — E782 Mixed hyperlipidemia: Secondary | ICD-10-CM

## 2022-10-17 NOTE — Patient Instructions (Signed)
Medication Instructions:  Your physician recommends that you continue on your current medications as directed. Please refer to the Current Medication list given to you today.  *If you need a refill on your cardiac medications before your next appointment, please call your pharmacy*   Lab Work: none If you have labs (blood work) drawn today and your tests are completely normal, you will receive your results only by: [place] (if you have MyChart) OR A paper copy in the mail If you have any lab test that is abnormal or we need to change your treatment, we will call you to review the results.   Testing/Procedures: none   Follow-Up: At [facility], you and your health needs are our priority.  As part of our continuing mission to provide you with exceptional heart care, we have created designated Provider Care Teams.  These Care Teams include your primary Cardiologist (physician) and Advanced Practice Providers (APPs -  Physician Assistants and Nurse Practitioners) who all work together to provide you with the care you need, when you need it.  We recommend signing up for the patient portal called "MyChart".  Sign up information is provided on this After Visit Summary.  MyChart is used to connect with patients for Virtual Visits (Telemedicine).  Patients are able to view lab/test results, encounter notes, upcoming appointments, etc.  Non-urgent messages can be sent to your provider as well.   To learn more about what you can do with MyChart, go to [email or web address].    Your next appointment:   3 month(s)  Provider:   Mertie Moores, MD

## 2022-10-26 ENCOUNTER — Encounter: Payer: Self-pay | Admitting: Cardiovascular Disease

## 2022-10-26 DIAGNOSIS — R002 Palpitations: Secondary | ICD-10-CM

## 2022-11-13 ENCOUNTER — Other Ambulatory Visit: Payer: Self-pay | Admitting: Cardiovascular Disease

## 2022-11-13 DIAGNOSIS — R5383 Other fatigue: Secondary | ICD-10-CM

## 2022-11-13 DIAGNOSIS — I499 Cardiac arrhythmia, unspecified: Secondary | ICD-10-CM

## 2022-11-13 NOTE — Progress Notes (Signed)
New order needed for Itamar per Arbie Cookey so pt can renew sleep study order

## 2022-11-28 ENCOUNTER — Telehealth: Payer: Self-pay | Admitting: *Deleted

## 2022-11-28 NOTE — Telephone Encounter (Signed)
See my chart notes for further infor if needed.   Pt came by the office today for new Itamar device. New device has been registered. The pt states that he will do study next week.   Called and made the patient aware that he may proceed with the [facility] Sleep Study. PIN # provided to the patient. Patient made aware that he will be contacted after the test has been read with the results and any recommendations. Patient verbalized understanding and thanked me for the call.

## 2022-12-01 ENCOUNTER — Other Ambulatory Visit: Payer: Self-pay | Admitting: *Deleted

## 2022-12-01 DIAGNOSIS — I6523 Occlusion and stenosis of bilateral carotid arteries: Secondary | ICD-10-CM

## 2022-12-09 ENCOUNTER — Encounter: Payer: Self-pay | Admitting: Vascular Surgery

## 2022-12-09 ENCOUNTER — Ambulatory Visit (HOSPITAL_COMMUNITY)
Admission: RE | Admit: 2022-12-09 | Discharge: 2022-12-09 | Disposition: A | Discharge status: Home / Self Care | Payer: PPO | Source: Ambulatory Visit | Attending: Vascular Surgery | Admitting: Vascular Surgery

## 2022-12-09 ENCOUNTER — Ambulatory Visit (INDEPENDENT_AMBULATORY_CARE_PROVIDER_SITE_OTHER): Payer: PPO | Admitting: Vascular Surgery

## 2022-12-09 VITALS — BP 157/79 | HR 60 | Temp 97.2°F | Resp 16 | Ht 68.0 in | Wt 225.0 lb

## 2022-12-09 DIAGNOSIS — I6523 Occlusion and stenosis of bilateral carotid arteries: Secondary | ICD-10-CM

## 2022-12-09 NOTE — Progress Notes (Signed)
Patient name: Eric Newman MRN: [id number] DOB: [birth year] Sex: male  REASON FOR VISIT: 1 year follow-up for carotid artery surveillance  HPI: Eric Newman is a 72 y.o. male with history of hypertension that presents for 1 year follow-up for carotid artery surveillance.  Patient previously had a right carotid endarterectomy on 01/27/2019 for symptomatic high-grade stenosis greater than 90%.  Has has had no neurologic events including weakness or numbness in the arm or leg or vision loss since last follow-up. Has started seeing cardiology for palpitations.  No other new concerns today.  Past Medical History:  Diagnosis Date   Carotid artery stenosis    H/O carotid endarterectomy    Hypertension    Irregular heart beat    Occlusion and stenosis of unspecified carotid artery    Palpitations     Past Surgical History:  Procedure Laterality Date   ENDARTERECTOMY Right 01/27/2019   Procedure: ENDARTERECTOMY CAROTID RIGHT;  Surgeon: Cephus Shelling, MD;  Location: [facility] OR;  Service: Vascular;  Laterality: Right;   PILONIDAL CYST EXCISION  1972    History reviewed. No pertinent family history.  SOCIAL HISTORY: Social History   Tobacco Use   Smoking status: Former    Types: Cigarettes    Quit date: 12/17/2018    Years since quitting: 3.9   Smokeless tobacco: Never  Substance Use Topics   Alcohol use: Yes    Alcohol/week: 12.0 standard drinks of alcohol    Types: 12 Cans of beer per week    No Known Allergies  Current Outpatient Medications  Medication Sig Dispense Refill   amLODipine (NORVASC) 10 MG tablet Take 10 mg by mouth daily.     aspirin EC 81 MG tablet Take 81 mg by mouth every evening.      famotidine (PEPCID) 10 MG tablet Take 10 mg by mouth 2 (two) times daily.     metoprolol tartrate (LOPRESSOR) 25 MG tablet Take 1 tablet (25 mg total) by mouth 2 (two) times daily. 180 tablet 3   rosuvastatin (CRESTOR) 20 MG tablet Take 20 mg by mouth every evening.       valsartan-hydrochlorothiazide (DIOVAN-HCT) 80-12.5 MG tablet Take 1 tablet by mouth daily.     No current facility-administered medications for this visit.    REVIEW OF SYSTEMS:  [X]  denotes positive finding, [ ]  denotes negative finding Cardiac  Comments:  Chest pain or chest pressure:    Shortness of breath upon exertion:    Short of breath when lying flat:    Irregular heart rhythm:        Vascular    Pain in calf, thigh, or hip brought on by ambulation:    Pain in feet at night that wakes you up from your sleep:     Blood clot in your veins:    Leg swelling:         Pulmonary    Oxygen at home:    Productive cough:     Wheezing:         Neurologic    Sudden weakness in arms or legs:     Sudden numbness in arms or legs:     Sudden onset of difficulty speaking or slurred speech:    Temporary loss of vision in one eye:     Problems with dizziness:         Gastrointestinal    Blood in stool:     Vomited blood:         Genitourinary  Burning when urinating:     Blood in urine:        Psychiatric    Major depression:         Hematologic    Bleeding problems:    Problems with blood clotting too easily:        Skin    Rashes or ulcers:        Constitutional    Fever or chills:      PHYSICAL EXAM: Vitals:   12/09/22 0803 12/09/22 0807  BP: (!) 147/82 (!) 157/79  Pulse: 61 60  Resp: 16   Temp: (!) 97.2 F (36.2 C)   TempSrc: Temporal   SpO2: 93%   Weight: 225 lb (102.1 kg)   Height: 5\' 8"  (1.727 m)     GENERAL: The patient is a well-nourished male, in no acute distress. The vital signs are documented above. CARDIAC: There is a regular rate and rhythm.  VASCULAR:  Previous right neck incision well healed PULMONARY: No respiratory distress. ABDOMEN: Soft and non-tender. MUSCULOSKELETAL: There are no major deformities or cyanosis. NEUROLOGIC: No focal weakness or paresthesias are detected.  CN II-XII grossly intact.   DATA:   Carotid duplex  today shows patent right carotid endarterectomy site with minimal 1-39% stenosis bilaterally  Assessment/Plan:  72 year old male status post right carotid endarterectomy on 01/27/2019 for symptomatic high-grade stenosis greater than 90% that presents for 1 year follow-up for ongoing carotid artery surveillance.  Discussed that his duplex is stable again today.  He has minimal disease on the right 1 to 39% stenosis where he had previous carotid endarterectomy.  On the left he also has minimal disease with 1 to 39% stenosis.  I will see him again in one year for continued surveillance.  Overall looks good today.  He is on aspirin and statin for risk reduction.   Cephus Shelling, MD [facility] Office: [phone]

## 2022-12-19 ENCOUNTER — Other Ambulatory Visit: Payer: Self-pay

## 2022-12-19 DIAGNOSIS — I6523 Occlusion and stenosis of bilateral carotid arteries: Secondary | ICD-10-CM

## 2022-12-21 ENCOUNTER — Encounter (INDEPENDENT_AMBULATORY_CARE_PROVIDER_SITE_OTHER): Payer: PPO | Admitting: Cardiology

## 2022-12-21 DIAGNOSIS — G4733 Obstructive sleep apnea (adult) (pediatric): Secondary | ICD-10-CM

## 2022-12-22 ENCOUNTER — Ambulatory Visit: Payer: PPO | Attending: Cardiovascular Disease | Admitting: Cardiology

## 2022-12-22 DIAGNOSIS — G4733 Obstructive sleep apnea (adult) (pediatric): Secondary | ICD-10-CM

## 2022-12-22 NOTE — Procedures (Signed)
SLEEP STUDY REPORT Patient Information Study Date: 12/21/2022 Patient Name: Eric Newman Patient ID: [id number] Birth Date: [birth year] Age: 72 Gender: Male BMI: 33.4 (W=220 lb, H=5' 8'') Stopbang: 6 Referring Physician: Kristeen Miss, MD  TEST DESCRIPTION: Home sleep apnea testing was completed using the WatchPat, a Type 1 device, utilizing peripheral arterial tonometry (PAT), chest movement, actigraphy, pulse oximetry, pulse rate, body position and snore.  AHI was calculated with apnea and hypopnea using valid sleep time as the denominator. RDI includes apneas, hypopneas, and RERAs.  The data acquired and the scoring of sleep and all associated events were performed in accordance with the recommended standards and specifications as outlined in the AASM Manual for the Scoring of Sleep and Associated Events 2.2.0 (2015).  FINDINGS:  1.  Severe Obstructive Sleep Apnea with AHI 68.8/hr.   2.  Mild Central Sleep Apnea with pAHIc 12.6/hr.  3.  Oxygen desaturations as low as 56%.  4.  Severe snoring was present. O2 sats were < 88% for 265.5 min.  5.  Total sleep time was 8 hrs and 40 min.  6.  28.5% of total sleep time was spent in REM sleep.   7.  Normal sleep onset latency at 24 min.   8.  Shortened REM sleep onset latency at 57 min.   9.  Total awakenings were 14.  10. Arrhythmia detection:  Suggestive of possible brief atrial fibrillation lasting 1 minute and 29  seconds.  This is not diagnostic and further testing with outpatient telemetry monitoring is recommended.  DIAGNOSIS:   Severe Obstructive Sleep Apnea (G47.33) Nocturnal Hypoxemia Possible Atrial Fibrillation  RECOMMENDATIONS:   1.  Clinical correlation of these findings is necessary.  The decision to treat obstructive sleep apnea (OSA) is usually based on the presence of apnea symptoms or the presence of associated medical conditions such as Hypertension, Congestive Heart Failure, Atrial Fibrillation or Obesity.  The most  common symptoms of OSA are snoring, gasping for breath while sleeping, daytime sleepiness and fatigue.   2.  Initiating apnea therapy is recommended given the presence of symptoms and/or associated conditions. Recommend proceeding with one of the following:     a.  Auto-CPAP therapy with a pressure range of 5-20cm H2O.     b.  An oral appliance (OA) that can be obtained from certain dentists with expertise in sleep medicine.  These are primarily of use in non-obese patients with mild and moderate disease.     c.  An ENT consultation which may be useful to look for specific causes of obstruction and possible treatment options.     d.  If patient is intolerant to PAP therapy, consider referral to ENT for evaluation for hypoglossal nerve stimulator.   3.  Close follow-up is necessary to ensure success with CPAP or oral appliance therapy for maximum benefit.  4.  A follow-up oximetry study on CPAP is recommended to assess the adequacy of therapy and determine the need for supplemental oxygen or the potential need for Bi-level therapy.  An arterial blood gas to determine the adequacy of baseline ventilation and oxygenation should also be considered.  5.  Healthy sleep recommendations include:  adequate nightly sleep (normal 7-9 hrs/night), avoidance of caffeine after noon and alcohol near bedtime, and maintaining a sleep environment that is cool, dark and quiet.  6.  Weight loss for overweight patients is recommended.  Even modest amounts of weight loss can significantly improve the severity of sleep apnea.  7.  Snoring recommendations include:  weight loss where appropriate, side sleeping, and avoidance of alcohol before bed.  8.  Operation of motor vehicle should be avoided when sleepy.  Signature: Armanda Magic, MD; [facility]; Diplomat, American Board of Sleep Medicine Electronically Signed: 12/22/2022 9:45:15 PM

## 2023-01-08 ENCOUNTER — Telehealth: Payer: Self-pay

## 2023-01-08 NOTE — Telephone Encounter (Signed)
Patient notified of sleep study results and recommendations. Patient verbalized, "I will talk to Dr. Elease Hashimoto at my July 1st appointment before I decide to wear the machine at night." All questions were answered and patient verbalized understanding.

## 2023-01-08 NOTE — Telephone Encounter (Signed)
-----   Message from Quintella Reichert, MD sent at 12/22/2022  9:47 PM EDT ----- Please let patient know that they have sleep apnea.  Recommend therapeutic CPAP titration for treatment of patient's sleep disordered breathing.  If unable to perform an in lab titration then initiate ResMed auto CPAP from 4 to 15cm H2O with heated humidity and mask of choice and overnight pulse ox on CPAP.

## 2023-01-14 ENCOUNTER — Other Ambulatory Visit: Payer: Self-pay

## 2023-01-14 DIAGNOSIS — G4733 Obstructive sleep apnea (adult) (pediatric): Secondary | ICD-10-CM

## 2023-02-08 NOTE — Progress Notes (Unsigned)
Cardiology Office Note:    Date:  02/09/2023   ID:  Eric Newman, DOB 07-Feb-1951, MRN [id number]  PCP:  Farris Has, MD   [facility] Providers Cardiologist:  New to [facility]   Referring MD: Farris Has, MD   Chief Complaint  Patient presents with   Hypertension    History of Present Illness:    Eric Newman is a 72 y.o. male with a hx of carotid artery disease, hypertension, palpitations  Has had palpitations since Aug. - sept. Saw his primary care doctor   Palps - are like a hard heart beath, sometimes just 1 extra heartbeat, sometimes several hard heart beats.  This sensation is uncomfortable but there is no specific chest pain or shortness of breath. No syncope or presyncope  Palpitations were very bad in Sept.   Have generally improved   Only new med change is DC of lisinopril / HCT  Started Amlodipine 10 mg QD and   Valsartan / HCT 80/12.5 QD    Labs from his primary medical doctor include a cholesterol profile: Total cholesterol is 166 Triglyceride level is 88 HDL is 61 LDL is 89   CMP showed K of 4.5   Knows that he quits breathing at night  Is very fatigued  Snores,  HTN No early morning headaches  Some daytime sleepiness.  Will screen  Still eats processed meats   Is scheduled for a colonoscopy on Dec. 21 He is at low risk for his colonoscopy .     October 17, 2022 Eric Newman is seen for follow up of his fatigue, HTN, ? OSA  The metoprolol has helped his palpitations  BP remains elevated No longer eating processed meats  Gets some exercise  Still doing wood working .   Takes amlodipine 10 mg  in the morning His med list includes  valsartan - HCT 80-12.5 - he does not recognize this  He will call back today to let us know if he is on it   February 09, 2023  Eric Newman is seen for follow up of his HTN,   OSA Has carotid artery disease His LDL goal is < 70 Lipids are being managed by Dr. Kateri Plummer   Is doing some wood working  Is doing power  carving  Carved his first bowl recently from an oak stump.  Turned out very nice      Past Medical History:  Diagnosis Date   Carotid artery stenosis    H/O carotid endarterectomy    Hypertension    Irregular heart beat    Occlusion and stenosis of unspecified carotid artery    Palpitations     Past Surgical History:  Procedure Laterality Date   ENDARTERECTOMY Right 01/27/2019   Procedure: ENDARTERECTOMY CAROTID RIGHT;  Surgeon: Cephus Shelling, MD;  Location: [facility] OR;  Service: Vascular;  Laterality: Right;   PILONIDAL CYST EXCISION  1972    Current Medications: Current Meds  Medication Sig   amLODipine (NORVASC) 10 MG tablet Take 10 mg by mouth daily.   aspirin EC 81 MG tablet Take 81 mg by mouth every evening.    famotidine (PEPCID) 10 MG tablet Take 10 mg by mouth 2 (two) times daily.   metoprolol tartrate (LOPRESSOR) 25 MG tablet Take 1 tablet (25 mg total) by mouth 2 (two) times daily.   rosuvastatin (CRESTOR) 20 MG tablet Take 20 mg by mouth every evening.    valsartan-hydrochlorothiazide (DIOVAN-HCT) 80-12.5 MG tablet Take 1 tablet by mouth daily.  Allergies:   Patient has no known allergies.   Social History   Socioeconomic History   Marital status: Single    Spouse name: Not on file   Number of children: Not on file   Years of education: Not on file   Highest education level: Not on file  Occupational History   Not on file  Tobacco Use   Smoking status: Former    Types: Cigarettes    Quit date: 12/17/2018    Years since quitting: 4.1   Smokeless tobacco: Never  Substance and Sexual Activity   Alcohol use: Yes    Alcohol/week: 12.0 standard drinks of alcohol    Types: 12 Cans of beer per week   Drug use: Never   Sexual activity: Not on file  Other Topics Concern   Not on file  Social History Narrative   Not on file   Social Determinants of Health   Financial Resource Strain: Not on file  Food Insecurity: Not on file  Transportation Needs:  Not on file  Physical Activity: Not on file  Stress: Not on file  Social Connections: Not on file    Family History: The patient's family history is not on file.  ROS:   Please see the history of present illness.     All other systems reviewed and are negative.  EKGs/Labs/Other Studies Reviewed:    The following studies were reviewed today:   EKG:   Recent Labs: No results found for requested labs within last 365 days.  Recent Lipid Panel No results found for: "CHOL", "TRIG", "HDL", "CHOLHDL", "VLDL", "LDLCALC", "LDLDIRECT"   Risk Assessment/Calculations:        STOP-Bang Score:  6       Physical Exam:    Physical Exam: Blood pressure 118/60, pulse (!) 54, height 5\' 8"  (1.727 m), weight 226 lb 12.8 oz (102.9 kg), SpO2 94 %.      GEN:  moderately obese male,  NAD  HEENT: Normal NECK: No JVD; No carotid bruits LYMPHATICS: No lymphadenopathy CARDIAC: RRR , no murmurs, rubs, gallops RESPIRATORY:  Clear to auscultation without rales, wheezing or rhonchi  ABDOMEN: Soft, non-tender, non-distended MUSCULOSKELETAL:  No edema; No deformity  SKIN: Warm and dry NEUROLOGIC:  Alert and oriented x 3   ASSESSMENT:    1. Palpitations   2. Stenosis of carotid artery, unspecified laterality   3. Primary hypertension   4. Mixed hyperlipidemia      PLAN:       Hyperlipidemia :   his lipids are managed by Dr. Kateri Plummer.   His last LDL was 89.  His goal LDL is less than 70 since he has a history of carotid artery disease and carotid endarterectomy.  He does not want to add any additional medications at the time.  He wants to continue to work on diet, exercise, weight loss.  2.  Hypertension: Blood pressure is well-controlled.  3.  Obstructive sleep apnea.  He was found to have a significant obstructive sleep apnea.  Will follow-up with Dr. Mayford Knife  .             Medication Adjustments/Labs and Tests Ordered: Current medicines are reviewed at length with the patient  today.  Concerns regarding medicines are outlined above.  Orders Placed This Encounter  Procedures   EKG 12-Lead   No orders of the defined types were placed in this encounter.     Patient Instructions  Medication Instructions:  Your physician recommends that you continue on your  current medications as directed. Please refer to the Current Medication list given to you today.  *If you need a refill on your cardiac medications before your next appointment, please call your pharmacy*  Lab Work: None ordered today.  Testing/Procedures: None ordered today.  Follow-Up: At [facility], you and your health needs are our priority.  As part of our continuing mission to provide you with exceptional heart care, we have created designated Provider Care Teams.  These Care Teams include your primary Cardiologist (physician) and Advanced Practice Providers (APPs -  Physician Assistants and Nurse Practitioners) who all work together to provide you with the care you need, when you need it.  Your next appointment:   1 year(s)  The format for your next appointment:   In Person  Provider:   Kristeen Miss, MD {   Signed, Kristeen Miss, MD  02/09/2023 11:48 AM    [facility]

## 2023-02-09 ENCOUNTER — Encounter: Payer: Self-pay | Admitting: Cardiovascular Disease

## 2023-02-09 ENCOUNTER — Ambulatory Visit: Payer: PPO | Attending: Cardiovascular Disease | Admitting: Cardiovascular Disease

## 2023-02-09 VITALS — BP 118/60 | HR 54 | Ht 68.0 in | Wt 226.8 lb

## 2023-02-09 DIAGNOSIS — E785 Hyperlipidemia, unspecified: Secondary | ICD-10-CM | POA: Insufficient documentation

## 2023-02-09 DIAGNOSIS — I10 Essential (primary) hypertension: Secondary | ICD-10-CM

## 2023-02-09 DIAGNOSIS — E782 Mixed hyperlipidemia: Secondary | ICD-10-CM

## 2023-02-09 DIAGNOSIS — I6529 Occlusion and stenosis of unspecified carotid artery: Secondary | ICD-10-CM

## 2023-02-09 DIAGNOSIS — R002 Palpitations: Secondary | ICD-10-CM

## 2023-02-09 NOTE — Patient Instructions (Signed)
Medication Instructions:  Your physician recommends that you continue on your current medications as directed. Please refer to the Current Medication list given to you today.  *If you need a refill on your cardiac medications before your next appointment, please call your pharmacy*  Lab Work: None ordered today.  Testing/Procedures: None ordered today.  Follow-Up: At [facility], you and your health needs are our priority.  As part of our continuing mission to provide you with exceptional heart care, we have created designated Provider Care Teams.  These Care Teams include your primary Cardiologist (physician) and Advanced Practice Providers (APPs -  Physician Assistants and Nurse Practitioners) who all work together to provide you with the care you need, when you need it.  Your next appointment:   1 year(s)  The format for your next appointment:   In Person  Provider:   Kristeen Miss, MD {

## 2023-02-22 NOTE — Progress Notes (Signed)
This encounter was created in error - please disregard.

## 2023-02-22 NOTE — Procedures (Signed)
Erroneous encounter

## 2023-04-02 DIAGNOSIS — I6529 Occlusion and stenosis of unspecified carotid artery: Secondary | ICD-10-CM | POA: Diagnosis not present

## 2023-04-02 DIAGNOSIS — E785 Hyperlipidemia, unspecified: Secondary | ICD-10-CM | POA: Diagnosis not present

## 2023-04-02 DIAGNOSIS — I77810 Thoracic aortic ectasia: Secondary | ICD-10-CM | POA: Diagnosis not present

## 2023-04-02 DIAGNOSIS — I10 Essential (primary) hypertension: Secondary | ICD-10-CM | POA: Diagnosis not present

## 2023-04-02 DIAGNOSIS — R7303 Prediabetes: Secondary | ICD-10-CM | POA: Diagnosis not present

## 2023-07-05 ENCOUNTER — Other Ambulatory Visit: Payer: Self-pay | Admitting: Cardiovascular Disease

## 2023-07-05 DIAGNOSIS — I472 Ventricular tachycardia, unspecified: Secondary | ICD-10-CM

## 2023-07-05 DIAGNOSIS — R5383 Other fatigue: Secondary | ICD-10-CM

## 2023-07-05 DIAGNOSIS — I10 Essential (primary) hypertension: Secondary | ICD-10-CM

## 2023-07-05 DIAGNOSIS — R002 Palpitations: Secondary | ICD-10-CM

## 2023-11-02 DIAGNOSIS — H25813 Combined forms of age-related cataract, bilateral: Secondary | ICD-10-CM | POA: Diagnosis not present

## 2023-11-02 DIAGNOSIS — H5203 Hypermetropia, bilateral: Secondary | ICD-10-CM | POA: Diagnosis not present

## 2023-11-02 DIAGNOSIS — H52223 Regular astigmatism, bilateral: Secondary | ICD-10-CM | POA: Diagnosis not present

## 2023-11-02 DIAGNOSIS — H524 Presbyopia: Secondary | ICD-10-CM | POA: Diagnosis not present

## 2023-11-02 DIAGNOSIS — H35371 Puckering of macula, right eye: Secondary | ICD-10-CM | POA: Diagnosis not present

## 2023-12-18 ENCOUNTER — Other Ambulatory Visit: Payer: Self-pay

## 2023-12-18 DIAGNOSIS — I6523 Occlusion and stenosis of bilateral carotid arteries: Secondary | ICD-10-CM

## 2023-12-28 NOTE — Progress Notes (Signed)
 Patient name: Eric Newman MRN: [id number] DOB: 11-07-1950 Sex: male  REASON FOR VISIT: 1 year follow-up for carotid artery surveillance  HPI: Eric Newman is a 73 y.o. male with history of hypertension that presents for 1 year follow-up for carotid artery surveillance.  Patient previously had a right carotid endarterectomy on 01/27/2019 for symptomatic high-grade stenosis greater than 90%.    Denies any neurologic events since last evaluation.  Did stop his statin therapy at his sister's advice.  Still on baby aspirin .  No new concerns today.  Past Medical History:  Diagnosis Date   Carotid artery stenosis    H/O carotid endarterectomy    Hypertension    Irregular heart beat    Occlusion and stenosis of unspecified carotid artery    Palpitations     Past Surgical History:  Procedure Laterality Date   ENDARTERECTOMY Right 01/27/2019   Procedure: ENDARTERECTOMY CAROTID RIGHT;  Surgeon: Young Hensen, MD;  Location: [facility] OR;  Service: Vascular;  Laterality: Right;   PILONIDAL CYST EXCISION  1972    No family history on file.  SOCIAL HISTORY: Social History   Tobacco Use   Smoking status: Former    Current packs/day: 0.00    Types: Cigarettes    Quit date: 12/17/2018    Years since quitting: 5.0   Smokeless tobacco: Never  Substance Use Topics   Alcohol use: Yes    Alcohol/week: 12.0 standard drinks of alcohol    Types: 12 Cans of beer per week    No Known Allergies  Current Outpatient Medications  Medication Sig Dispense Refill   amLODipine (NORVASC) 10 MG tablet Take 10 mg by mouth daily.     aspirin  EC 81 MG tablet Take 81 mg by mouth every evening.      famotidine  (PEPCID ) 10 MG tablet Take 10 mg by mouth 2 (two) times daily.     metoprolol  tartrate (LOPRESSOR ) 25 MG tablet Take 1 tablet by mouth twice daily 180 tablet 2   rosuvastatin  (CRESTOR ) 20 MG tablet Take 20 mg by mouth every evening.      valsartan-hydrochlorothiazide  (DIOVAN-HCT) 80-12.5 MG tablet  Take 1 tablet by mouth daily.     No current facility-administered medications for this visit.    REVIEW OF SYSTEMS:  [X]  denotes positive finding, [ ]  denotes negative finding Cardiac  Comments:  Chest pain or chest pressure:    Shortness of breath upon exertion:    Short of breath when lying flat:    Irregular heart rhythm:        Vascular    Pain in calf, thigh, or hip brought on by ambulation:    Pain in feet at night that wakes you up from your sleep:     Blood clot in your veins:    Leg swelling:         Pulmonary    Oxygen at home:    Productive cough:     Wheezing:         Neurologic    Sudden weakness in arms or legs:     Sudden numbness in arms or legs:     Sudden onset of difficulty speaking or slurred speech:    Temporary loss of vision in one eye:     Problems with dizziness:         Gastrointestinal    Blood in stool:     Vomited blood:         Genitourinary    Burning when  urinating:     Blood in urine:        Psychiatric    Major depression:         Hematologic    Bleeding problems:    Problems with blood clotting too easily:        Skin    Rashes or ulcers:        Constitutional    Fever or chills:      PHYSICAL EXAM: There were no vitals filed for this visit.   GENERAL: The patient is a well-nourished male, in no acute distress. The vital signs are documented above. CARDIAC: There is a regular rate and rhythm.  VASCULAR:  Previous right neck incision well healed PULMONARY: No respiratory distress. ABDOMEN: Soft and non-tender. MUSCULOSKELETAL: There are no major deformities or cyanosis. NEUROLOGIC: No focal weakness or paresthesias are detected.  CN II-XII grossly intact.   DATA:   Carotid duplex today shows patent right carotid endarterectomy site with minimal 1-39% stenosis bilaterally  Assessment/Plan:  73 year old male status post right carotid endarterectomy on 01/27/2019 for symptomatic high-grade stenosis greater than  90% that presents for 1 year follow-up for ongoing carotid artery surveillance.  Discussed that his duplex is stable again today.  He has minimal disease on the right 1 to 39% stenosis where he had previous carotid endarterectomy.  On the left he also has minimal disease with 1 to 39% stenosis.  I will see him again in one year for continued surveillance.  Overall looks good today.  I did encourage him to restart his statin therapy for optimal medical therapy and he is also on aspirin  for risk reduction.  Discussed he call with any questions or concerns.   Young Hensen, MD [facility] Office: [phone]

## 2023-12-29 ENCOUNTER — Encounter: Payer: Self-pay | Admitting: Vascular Surgery

## 2023-12-29 ENCOUNTER — Ambulatory Visit (HOSPITAL_COMMUNITY)
Admission: RE | Admit: 2023-12-29 | Discharge: 2023-12-29 | Disposition: A | Discharge status: Home / Self Care | Payer: PPO | Source: Ambulatory Visit | Attending: Vascular Surgery | Admitting: Vascular Surgery

## 2023-12-29 ENCOUNTER — Ambulatory Visit: Payer: PPO | Attending: Vascular Surgery | Admitting: Vascular Surgery

## 2023-12-29 VITALS — BP 137/74 | HR 52 | Temp 98.2°F | Resp 18 | Ht 68.0 in | Wt 223.2 lb

## 2023-12-29 DIAGNOSIS — I6523 Occlusion and stenosis of bilateral carotid arteries: Secondary | ICD-10-CM

## 2024-01-21 ENCOUNTER — Encounter: Payer: Self-pay | Admitting: Cardiovascular Disease

## 2024-04-01 ENCOUNTER — Other Ambulatory Visit: Payer: Self-pay | Admitting: Physician Assistant

## 2024-04-01 DIAGNOSIS — R002 Palpitations: Secondary | ICD-10-CM

## 2024-04-01 DIAGNOSIS — R5383 Other fatigue: Secondary | ICD-10-CM

## 2024-04-01 DIAGNOSIS — I10 Essential (primary) hypertension: Secondary | ICD-10-CM

## 2024-04-01 DIAGNOSIS — I472 Ventricular tachycardia, unspecified: Secondary | ICD-10-CM

## 2024-04-05 MED ORDER — METOPROLOL TARTRATE 25 MG PO TABS: 25.0000 mg | ORAL_TABLET | Freq: Two times a day (BID) | ORAL | 0 refills | Status: DC

## 2024-04-15 DIAGNOSIS — Z0000 Encounter for general adult medical examination without abnormal findings: Secondary | ICD-10-CM | POA: Diagnosis not present

## 2024-04-15 DIAGNOSIS — E785 Hyperlipidemia, unspecified: Secondary | ICD-10-CM | POA: Diagnosis not present

## 2024-04-15 DIAGNOSIS — I6529 Occlusion and stenosis of unspecified carotid artery: Secondary | ICD-10-CM | POA: Diagnosis not present

## 2024-04-15 DIAGNOSIS — I77810 Thoracic aortic ectasia: Secondary | ICD-10-CM | POA: Diagnosis not present

## 2024-04-15 DIAGNOSIS — R7303 Prediabetes: Secondary | ICD-10-CM | POA: Diagnosis not present

## 2024-04-15 DIAGNOSIS — I10 Essential (primary) hypertension: Secondary | ICD-10-CM | POA: Diagnosis not present

## 2024-05-05 ENCOUNTER — Other Ambulatory Visit: Payer: Self-pay

## 2024-05-05 DIAGNOSIS — I10 Essential (primary) hypertension: Secondary | ICD-10-CM

## 2024-05-05 DIAGNOSIS — R5383 Other fatigue: Secondary | ICD-10-CM

## 2024-05-05 DIAGNOSIS — R002 Palpitations: Secondary | ICD-10-CM

## 2024-05-05 DIAGNOSIS — I472 Ventricular tachycardia, unspecified: Secondary | ICD-10-CM

## 2024-05-05 MED ORDER — METOPROLOL TARTRATE 25 MG PO TABS: 25.0000 mg | ORAL_TABLET | Freq: Two times a day (BID) | ORAL | 0 refills | Status: DC

## 2024-05-18 ENCOUNTER — Other Ambulatory Visit: Payer: Self-pay | Admitting: Physician Assistant

## 2024-05-18 DIAGNOSIS — R5383 Other fatigue: Secondary | ICD-10-CM

## 2024-05-18 DIAGNOSIS — I10 Essential (primary) hypertension: Secondary | ICD-10-CM

## 2024-05-18 DIAGNOSIS — I472 Ventricular tachycardia, unspecified: Secondary | ICD-10-CM

## 2024-05-18 DIAGNOSIS — R002 Palpitations: Secondary | ICD-10-CM

## 2024-06-13 ENCOUNTER — Other Ambulatory Visit: Payer: Self-pay | Admitting: Physician Assistant

## 2024-06-13 DIAGNOSIS — R5383 Other fatigue: Secondary | ICD-10-CM

## 2024-06-13 DIAGNOSIS — I472 Ventricular tachycardia, unspecified: Secondary | ICD-10-CM

## 2024-06-13 DIAGNOSIS — I10 Essential (primary) hypertension: Secondary | ICD-10-CM

## 2024-06-13 DIAGNOSIS — R002 Palpitations: Secondary | ICD-10-CM

## 2024-06-22 NOTE — Progress Notes (Signed)
 Cardiology Office Note:   Date:  06/22/2024  ID:  Eric Newman, DOB [birth year], MRN [id number] PCP: Eric Righter, MD  [facility] Providers Cardiologist:  Newman Archer, MD { Chief Complaint:  Chief Complaint  Patient presents with   Follow-up      History of Present Illness:   Eric Newman is a 73 y.o. male with a PMH of BAV w/ moderate AS, CAS s/p R CEA, HTN, HLD, and OSA who presents for follow up.  Patient presents today for follow-up.  He has no complaints.  He checks his blood pressure at home and it is consistent to what we see here in clinic.  He is taking all of his medications as prescribed without side effect.   Past Medical History:  Diagnosis Date   Carotid artery stenosis    H/O carotid endarterectomy    Hyperlipidemia    Hypertension    Irregular heart beat    Occlusion and stenosis of unspecified carotid artery    Palpitations      Studies Reviewed:    EKG:  EKG Interpretation Date/Time:  Thursday June 23 2024 09:01:05 EST Ventricular Rate:  52 PR Interval:  170 QRS Duration:  88 QT Interval:  458 QTC Calculation: 425 R Axis:   12  Text Interpretation: Sinus bradycardia When compared with ECG of 24-Jan-2019 09:03, No significant change was found Confirmed by Eric Newman [phone]) on 06/23/2024 9:12:11 AM     Cardiac Studies & Procedures   ______________________________________________________________________________________________     ECHOCARDIOGRAM  ECHOCARDIOGRAM COMPLETE 07/17/2022  Narrative ECHOCARDIOGRAM REPORT    Patient Name:   Eric Newman Date of Exam: 07/17/2022 Medical Rec #:  [id number]     Height:       68.0 in Accession #:    [id number]    Weight:       220.2 lb Date of Birth:  Oct 12, 1950      BSA:          2.129 m Patient Age:    71 years      BP:           148/80 mmHg Patient Gender: M             HR:           54 bpm. Exam Location:  [address]  Procedure: 2D Echo, Cardiac Doppler and Color  Doppler  Indications:    R00.0 Tachycardia R00.2 Palpitations.  History:        Patient has prior history of Echocardiogram examinations, most recent 12/30/2018. Risk Factors:Hypertension. Palpitations.  Sonographer:    Eric Newman Referring Phys: [id number] Eric Newman  IMPRESSIONS   1. Left ventricular ejection fraction, by estimation, is 55 to 60%. The left ventricle has normal function. The left ventricle has no regional wall motion abnormalities. Left ventricular diastolic parameters are indeterminate. 2. Right ventricular systolic function is normal. The right ventricular size is mildly enlarged. 3. The mitral valve is normal in structure. Trivial mitral valve regurgitation. 4. The aortic valve is bicuspid. Fusion of left and right cusps. There is moderate calcification of the aortic valve. Aortic valve regurgitation is mild to moderate. Moderate aortic valve stenosis. AS mild by gradients (Vmax 2.3 m/s, MG 12 mmHg) but moderate by AVA (1.1 cm^2) and DI (0.31). Low SV index (27 cc/m^2), suspect paradoxical low flow low gradient moderae AS 5. Aortic dilatation noted. There is dilatation of the ascending aorta, measuring 43 mm.  FINDINGS Left Ventricle: Left  ventricular ejection fraction, by estimation, is 55 to 60%. The left ventricle has normal function. The left ventricle has no regional wall motion abnormalities. The left ventricular internal cavity size was normal in size. There is no left ventricular hypertrophy. Left ventricular diastolic parameters are indeterminate.  Right Ventricle: The right ventricular size is mildly enlarged. No increase in right ventricular wall thickness. Right ventricular systolic function is normal.  Left Atrium: Left atrial size was normal in size.  Right Atrium: Right atrial size was normal in size.  Pericardium: There is no evidence of pericardial effusion.  Mitral Valve: The mitral valve is normal in structure. Trivial mitral  valve regurgitation.  Tricuspid Valve: The tricuspid valve is normal in structure. Tricuspid valve regurgitation is trivial.  Aortic Valve: The aortic valve is bicuspid. There is moderate calcification of the aortic valve. Aortic valve regurgitation is mild to moderate. Aortic regurgitation PHT measures 698 msec. Moderate aortic stenosis is present. Aortic valve mean gradient measures 10.0 mmHg. Aortic valve peak gradient measures 17.3 mmHg. Aortic valve area, by VTI measures 1.12 cm.  Pulmonic Valve: The pulmonic valve was grossly normal. Pulmonic valve regurgitation is trivial.  Aorta: The aortic root is normal in size and structure and aortic dilatation noted. There is dilatation of the ascending aorta, measuring 43 mm.  IAS/Shunts: The interatrial septum was not well visualized.   LEFT VENTRICLE PLAX 2D LVIDd:         5.20 cm   Diastology LVIDs:         3.80 cm   LV e' medial:    6.04 cm/s LV PW:         1.00 cm   LV E/e' medial:  16.2 LV IVS:        1.00 cm   LV e' lateral:   9.46 cm/s LVOT diam:     2.10 cm   LV E/e' lateral: 10.3 LV SV:         57 LV SV Index:   27 LVOT Area:     3.46 cm   RIGHT VENTRICLE             IVC RV Basal diam:  4.20 cm     IVC diam: 2.20 cm RV S prime:     13.50 cm/s TAPSE (M-mode): 2.3 cm  LEFT ATRIUM             Index        RIGHT ATRIUM           Index LA diam:        4.60 cm 2.16 cm/m   RA Area:     19.00 cm LA Vol (A2C):   52.7 ml 24.75 ml/m  RA Volume:   60.10 ml  28.23 ml/m LA Vol (A4C):   70.1 ml 32.93 ml/m LA Biplane Vol: 61.9 ml 29.07 ml/m AORTIC VALVE AV Area (Vmax):    1.02 cm AV Area (Vmean):   0.98 cm AV Area (VTI):     1.12 cm AV Vmax:           208.00 cm/s AV Vmean:          148.000 cm/s AV VTI:            0.508 m AV Peak Grad:      17.3 mmHg AV Mean Grad:      10.0 mmHg LVOT Vmax:         61.10 cm/s LVOT Vmean:        41.950 cm/s  LVOT VTI:          0.164 m LVOT/AV VTI ratio: 0.32 AI PHT:            698  msec  AORTA Ao Root diam: 3.80 cm Ao Asc diam:  4.30 cm  MITRAL VALVE MV Area (PHT): 3.68 cm    SHUNTS MV Decel Time: 206 msec    Systemic VTI:  0.16 m MV E velocity: 97.70 cm/s  Systemic Diam: 2.10 cm MV A velocity: 75.50 cm/s MV E/A ratio:  1.29  Eric Nanas MD Electronically signed by Eric Nanas MD Signature Date/Time: 07/17/2022/12:50:51 PM    Final    MONITORS  LONG TERM MONITOR (3-14 DAYS) 05/26/2022  Narrative Sinus rhythm HR 48-74, avg 60 bpm Diary events correlated with ventricular and ventricular ectopy Frequent supraventricular ectopy, 5.7% -- occurring in isolated beats, clusters, and runs; occasional ventricular ectopy, 1.3%   Patch Wear Time:  2 days and 22 hours (2023-10-05T11:30:03-399 to 2023-10-08T09:55:56-0400)  Patient had a min HR of 48 bpm, max HR of 152 bpm, and avg HR of 61 bpm. Predominant underlying rhythm was Sinus Rhythm. 1 run of Ventricular Tachycardia occurred lasting 20 beats with a max rate of 152 bpm (avg 121 bpm). 49 Supraventricular Tachycardia runs occurred, the run with the fastest interval lasting 6 beats with a max rate of 152 bpm, the longest lasting 19 beats with an avg rate of 104 bpm. Some episodes of Supraventricular Tachycardia may be possible Atrial Tachycardia with variable block. Isolated SVEs were frequent (5.7%, 13630), SVE Couplets were occasional (2.0%, 2425), and SVE Triplets were rare (<1.0%, 99). Isolated VEs were occasional (1.3%, 3008), VE Couplets were rare (<1.0%, 37), and VE Triplets were rare (<1.0%, 4). Ventricular Bigeminy and Trigeminy were present.       ______________________________________________________________________________________________      Risk Assessment/Calculations:      STOP-Bang Score:          Physical Exam:     VS:  BP (!) 144/78 (BP Location: Left Arm, Patient Position: Sitting)   Pulse (!) 52   Ht 5' 8 (1.727 m)   Wt 231 lb (104.8 kg)   SpO2 96%   BMI  35.12 kg/m      Wt Readings from Last 3 Encounters:  12/29/23 223 lb 3.2 oz (101.2 kg)  02/09/23 226 lb 12.8 oz (102.9 kg)  12/09/22 225 lb (102.1 kg)     GEN: Well nourished, well developed, in no acute distress NECK: No JVD; No carotid bruits CARDIAC: RRR, II/VI soft squeaking systolic murmur at the RUSB, no rubs or gallops RESPIRATORY:  Clear to auscultation without rales, wheezing or rhonchi  ABDOMEN: Soft, non-tender, non-distended, normal bowel sounds EXTREMITIES:  Warm and well perfused, no edema; No deformity, 2+ radial pulses PSYCH: Normal mood and affect   Assessment & Plan Nonrheumatic aortic valve stenosis - Patient noted to have moderate AS with bicuspid aortic valve on echocardiogram 2 years ago. -When I brought this up to the patient he seemed to not be aware of this diagnosis. -I reviewed the patient's echocardiogram myself and the patient's aortic valve is bicuspid; however, his heart mean to tell if this is functionally bicuspid from calcification or congenitally bicuspid. -Either way he needs a repeat echocardiogram for surveillance of his AS.  If repeat echocardiogram confirms BAV then I will also pursue imaging of his thoracic aorta for aneurysms. Complete echo Primary hypertension - His blood pressure remains too high. - I will increase his dosage of  Diovan to improve blood pressure control. Increase dose of valsartan to 160 mg daily and HCTZ to 25 mg daily He will double up on his current Diovan tablet until he runs out and then will transition to 1 tablet a day at the higher dose. Check BMP in 1 week for potassium and creatinine on new dosage Palpitations - Patient was previously on metoprolol  for palpitations.  He has been on this medication for a long time. -He is interested in getting off of this medication if possible. -I told him that it is fine to discontinue it, but if his palpitations come back then he can always restart. -I will refill his  metoprolol  so that he has some available if he chooses to continue it. Metoprolol  refilled, but the patient will trial stopping it to see if his palpitations come back. Aortic dilatation - Mild dilation of the aorta found on last echo. -Will repeat echo to assess his aortic valve as well as his aortic dilation. -Will likely need dedicated CT imaging of his aorta as well. Complete echo Likely CT scan of the aorta in the future as well Stenosis of carotid artery, unspecified laterality - No symptoms and no changes. Continue baby aspirin           This note was written with the assistance of a dictation microphone or AI dictation software. Please excuse any typos or grammatical errors.   Signed, Newman Archer, MD 06/22/2024 8:48 PM    [facility]

## 2024-06-23 ENCOUNTER — Ambulatory Visit
Attending: Student in an Organized Health Care Education/Training Program | Admitting: Student in an Organized Health Care Education/Training Program

## 2024-06-23 ENCOUNTER — Encounter: Payer: Self-pay | Admitting: Student in an Organized Health Care Education/Training Program

## 2024-06-23 VITALS — BP 144/78 | HR 52 | Ht 68.0 in | Wt 231.0 lb

## 2024-06-23 DIAGNOSIS — Q2381 Bicuspid aortic valve: Secondary | ICD-10-CM | POA: Insufficient documentation

## 2024-06-23 DIAGNOSIS — R5383 Other fatigue: Secondary | ICD-10-CM

## 2024-06-23 DIAGNOSIS — I77819 Aortic ectasia, unspecified site: Secondary | ICD-10-CM | POA: Insufficient documentation

## 2024-06-23 DIAGNOSIS — I6529 Occlusion and stenosis of unspecified carotid artery: Secondary | ICD-10-CM

## 2024-06-23 DIAGNOSIS — I10 Essential (primary) hypertension: Secondary | ICD-10-CM | POA: Diagnosis not present

## 2024-06-23 DIAGNOSIS — R002 Palpitations: Secondary | ICD-10-CM

## 2024-06-23 DIAGNOSIS — Z79899 Other long term (current) drug therapy: Secondary | ICD-10-CM | POA: Diagnosis not present

## 2024-06-23 DIAGNOSIS — I350 Nonrheumatic aortic (valve) stenosis: Secondary | ICD-10-CM | POA: Insufficient documentation

## 2024-06-23 MED ORDER — VALSARTAN-HYDROCHLOROTHIAZIDE 160-25 MG PO TABS: 1.0000 | ORAL_TABLET | Freq: Every day | ORAL | 3 refills | Status: AC

## 2024-06-23 MED ORDER — METOPROLOL TARTRATE 25 MG PO TABS: 25.0000 mg | ORAL_TABLET | Freq: Two times a day (BID) | ORAL | 3 refills | Status: AC

## 2024-06-23 NOTE — Assessment & Plan Note (Signed)
-   Mild dilation of the aorta found on last echo. -Will repeat echo to assess his aortic valve as well as his aortic dilation. -Will likely need dedicated CT imaging of his aorta as well. Complete echo Likely CT scan of the aorta in the future as well

## 2024-06-23 NOTE — Patient Instructions (Signed)
 Medication Instructions:  Increase Valsartan-Hydrochlorothiazide  (Diovan-HCT) 160-25 mg take one tablet daily. You may take 2 tablets of your current medication Valsartan-Hydrochlorothiazide  (Diovan-HCT) 80-12.5 mg until you pick up your new prescription.  *If you need a refill on your cardiac medications before your next appointment, please call your pharmacy*  Lab Work: 06/30/24- BMET If you have labs (blood work) drawn today and your tests are completely normal, you will receive your results only by: MyChart Message (if you have MyChart) OR A paper copy in the mail If you have any lab test that is abnormal or we need to change your treatment, we will call you to review the results.  Testing/Procedures: Your physician has requested that you have an echocardiogram. Echocardiography is a painless test that uses sound waves to create images of your heart. It provides your doctor with information about the size and shape of your heart and how well your heart's chambers and valves are working. This procedure takes approximately one hour. There are no restrictions for this procedure. Please do NOT wear cologne, perfume, aftershave, or lotions (deodorant is allowed). Please arrive 15 minutes prior to your appointment time.  Please note: We ask at that you not bring children with you during ultrasound (echo/ vascular) testing. Due to room size and safety concerns, children are not allowed in the ultrasound rooms during exams. Our front office staff cannot provide observation of children in our lobby area while testing is being conducted. An adult accompanying a patient to their appointment will only be allowed in the ultrasound room at the discretion of the ultrasound technician under special circumstances. We apologize for any inconvenience.   Follow-Up: At [facility], you and your health needs are our priority.  As part of our continuing mission to provide you with exceptional heart care,  our providers are all part of one team.  This team includes your primary Cardiologist (physician) and Advanced Practice Providers or APPs (Physician Assistants and Nurse Practitioners) who all work together to provide you with the care you need, when you need it.  Your next appointment:   1 year(s)  Provider:   Georganna Archer, MD

## 2024-06-23 NOTE — Assessment & Plan Note (Signed)
-   No symptoms and no changes. Continue baby aspirin

## 2024-06-23 NOTE — Assessment & Plan Note (Signed)
-   Patient noted to have moderate AS with bicuspid aortic valve on echocardiogram 2 years ago. -When I brought this up to the patient he seemed to not be aware of this diagnosis. -I reviewed the patient's echocardiogram myself and the patient's aortic valve is bicuspid; however, his heart mean to tell if this is functionally bicuspid from calcification or congenitally bicuspid. -Either way he needs a repeat echocardiogram for surveillance of his AS.  If repeat echocardiogram confirms BAV then I will also pursue imaging of his thoracic aorta for aneurysms. Complete echo

## 2024-06-23 NOTE — Assessment & Plan Note (Signed)
-   His blood pressure remains too high. - I will increase his dosage of Diovan to improve blood pressure control. Increase dose of valsartan to 160 mg daily and HCTZ to 25 mg daily He will double up on his current Diovan tablet until he runs out and then will transition to 1 tablet a day at the higher dose. Check BMP in 1 week for potassium and creatinine on new dosage

## 2024-08-08 ENCOUNTER — Ambulatory Visit (HOSPITAL_COMMUNITY)
Admission: RE | Admit: 2024-08-08 | Discharge: 2024-08-08 | Disposition: A | Discharge status: Home / Self Care | Source: Ambulatory Visit | Attending: Internal Medicine | Admitting: Internal Medicine

## 2024-08-08 DIAGNOSIS — I77819 Aortic ectasia, unspecified site: Secondary | ICD-10-CM | POA: Insufficient documentation

## 2024-08-08 DIAGNOSIS — I6529 Occlusion and stenosis of unspecified carotid artery: Secondary | ICD-10-CM | POA: Insufficient documentation

## 2024-08-08 DIAGNOSIS — I350 Nonrheumatic aortic (valve) stenosis: Secondary | ICD-10-CM | POA: Diagnosis not present

## 2024-08-08 LAB — ECHOCARDIOGRAM COMPLETE
AR max vel: 0.93 cm2
AV Area VTI: 1.17 cm2
AV Area mean vel: 1.02 cm2
AV Mean grad: 22 mmHg
AV Peak grad: 39.9 mmHg
Ao pk vel: 3.16 m/s
Area-P 1/2: 2.59 cm2
P 1/2 time: 613 ms
S' Lateral: 3.9 cm

## 2024-08-09 ENCOUNTER — Ambulatory Visit: Payer: Self-pay | Admitting: Student in an Organized Health Care Education/Training Program

## 2024-08-09 DIAGNOSIS — I712 Thoracic aortic aneurysm, without rupture, unspecified: Secondary | ICD-10-CM

## 2024-08-09 LAB — BASIC METABOLIC PANEL WITH GFR
BUN/Creatinine Ratio: 18 (ref 10–24)
BUN: 23 mg/dL (ref 8–27)
CO2: 22 mmol/L (ref 20–29)
Calcium: 9.1 mg/dL (ref 8.6–10.2)
Chloride: 102 mmol/L (ref 96–106)
Creatinine, Ser: 1.26 mg/dL (ref 0.76–1.27)
Glucose: 106 mg/dL — ABNORMAL HIGH (ref 70–99)
Potassium: 4.8 mmol/L (ref 3.5–5.2)
Sodium: 138 mmol/L (ref 134–144)
eGFR: 60 mL/min/1.73

## 2024-08-19 ENCOUNTER — Ambulatory Visit (HOSPITAL_COMMUNITY)
Admission: RE | Admit: 2024-08-19 | Discharge: 2024-08-19 | Disposition: A | Source: Ambulatory Visit | Attending: Cardiology | Admitting: Cardiology

## 2024-08-19 DIAGNOSIS — I712 Thoracic aortic aneurysm, without rupture, unspecified: Secondary | ICD-10-CM | POA: Insufficient documentation

## 2024-08-19 MED ORDER — IOHEXOL 350 MG/ML SOLN
75.0000 mL | Freq: Once | INTRAVENOUS | Status: AC | PRN
  Administered 2024-08-19: 75 mL via INTRAVENOUS

## 2024-08-23 ENCOUNTER — Ambulatory Visit: Payer: Self-pay | Admitting: Student in an Organized Health Care Education/Training Program

## 2024-08-23 ENCOUNTER — Encounter: Payer: Self-pay | Admitting: Student in an Organized Health Care Education/Training Program

## 2024-08-23 DIAGNOSIS — I2510 Atherosclerotic heart disease of native coronary artery without angina pectoris: Secondary | ICD-10-CM | POA: Insufficient documentation

## 2024-08-23 DIAGNOSIS — R9389 Abnormal findings on diagnostic imaging of other specified body structures: Secondary | ICD-10-CM

## 2025-02-27 ENCOUNTER — Other Ambulatory Visit (HOSPITAL_COMMUNITY)
